# Patient Record
Sex: Female | Born: 1960 | Race: White | Hispanic: No | Marital: Married | Smoking: Never smoker
Health system: Southern US, Community
[De-identification: ages and names within clinical notes are randomized; demographics above are authoritative.]

## PROBLEM LIST (undated history)

## (undated) DIAGNOSIS — K219 Gastro-esophageal reflux disease without esophagitis: Secondary | ICD-10-CM

## (undated) DIAGNOSIS — D649 Anemia, unspecified: Secondary | ICD-10-CM

## (undated) DIAGNOSIS — R519 Headache, unspecified: Secondary | ICD-10-CM

## (undated) DIAGNOSIS — R51 Headache: Secondary | ICD-10-CM

## (undated) DIAGNOSIS — R748 Abnormal levels of other serum enzymes: Secondary | ICD-10-CM

## (undated) DIAGNOSIS — IMO0002 Reserved for concepts with insufficient information to code with codable children: Secondary | ICD-10-CM

## (undated) DIAGNOSIS — Z87442 Personal history of urinary calculi: Secondary | ICD-10-CM

## (undated) DIAGNOSIS — M8580 Other specified disorders of bone density and structure, unspecified site: Secondary | ICD-10-CM

## (undated) DIAGNOSIS — E209 Hypoparathyroidism, unspecified: Secondary | ICD-10-CM

## (undated) DIAGNOSIS — E559 Vitamin D deficiency, unspecified: Secondary | ICD-10-CM

## (undated) DIAGNOSIS — Z86010 Personal history of colonic polyps: Secondary | ICD-10-CM

## (undated) HISTORY — DX: Vitamin D deficiency, unspecified: E55.9

## (undated) HISTORY — PX: TONSILLECTOMY: SUR1361

## (undated) HISTORY — DX: Personal history of colonic polyps: Z86.010

## (undated) HISTORY — DX: Hypercalcemia: E83.52

---

## 1997-04-29 HISTORY — PX: DIAGNOSTIC LAPAROSCOPY: SUR761

## 1998-03-10 ENCOUNTER — Other Ambulatory Visit: Admission: RE | Admit: 1998-03-10 | Discharge: 1998-03-10 | Payer: Self-pay | Admitting: Obstetrics and Gynecology

## 1998-12-01 ENCOUNTER — Encounter: Payer: Self-pay | Admitting: Obstetrics and Gynecology

## 1998-12-01 ENCOUNTER — Ambulatory Visit (HOSPITAL_COMMUNITY): Admission: RE | Admit: 1998-12-01 | Discharge: 1998-12-01 | Payer: Self-pay | Admitting: Internal Medicine

## 1999-02-05 ENCOUNTER — Other Ambulatory Visit: Admission: RE | Admit: 1999-02-05 | Discharge: 1999-02-05 | Payer: Self-pay | Admitting: Obstetrics and Gynecology

## 1999-08-09 ENCOUNTER — Inpatient Hospital Stay (HOSPITAL_COMMUNITY): Admission: AD | Admit: 1999-08-09 | Discharge: 1999-08-09 | Payer: Self-pay | Admitting: Obstetrics and Gynecology

## 1999-08-15 ENCOUNTER — Inpatient Hospital Stay (HOSPITAL_COMMUNITY): Admission: AD | Admit: 1999-08-15 | Discharge: 1999-08-17 | Payer: Self-pay | Admitting: Obstetrics and Gynecology

## 1999-09-20 ENCOUNTER — Other Ambulatory Visit: Admission: RE | Admit: 1999-09-20 | Discharge: 1999-09-20 | Payer: Self-pay | Admitting: Obstetrics and Gynecology

## 2000-08-04 ENCOUNTER — Other Ambulatory Visit: Admission: RE | Admit: 2000-08-04 | Discharge: 2000-08-04 | Payer: Self-pay | Admitting: Obstetrics and Gynecology

## 2000-11-19 ENCOUNTER — Inpatient Hospital Stay (HOSPITAL_COMMUNITY): Admission: AD | Admit: 2000-11-19 | Discharge: 2000-11-19 | Payer: Self-pay | Admitting: Obstetrics and Gynecology

## 2001-02-09 ENCOUNTER — Inpatient Hospital Stay (HOSPITAL_COMMUNITY): Admission: AD | Admit: 2001-02-09 | Discharge: 2001-02-12 | Payer: Self-pay | Admitting: Obstetrics and Gynecology

## 2001-04-06 ENCOUNTER — Other Ambulatory Visit: Admission: RE | Admit: 2001-04-06 | Discharge: 2001-04-06 | Payer: Self-pay | Admitting: Obstetrics and Gynecology

## 2002-04-13 ENCOUNTER — Other Ambulatory Visit: Admission: RE | Admit: 2002-04-13 | Discharge: 2002-04-13 | Payer: Self-pay | Admitting: Obstetrics and Gynecology

## 2004-07-09 ENCOUNTER — Other Ambulatory Visit: Admission: RE | Admit: 2004-07-09 | Discharge: 2004-07-09 | Payer: Self-pay | Admitting: Obstetrics and Gynecology

## 2005-08-06 ENCOUNTER — Other Ambulatory Visit: Admission: RE | Admit: 2005-08-06 | Discharge: 2005-08-06 | Payer: Self-pay | Admitting: Obstetrics and Gynecology

## 2006-10-27 ENCOUNTER — Ambulatory Visit: Payer: Self-pay | Admitting: Internal Medicine

## 2006-12-31 ENCOUNTER — Ambulatory Visit: Payer: Self-pay | Admitting: Internal Medicine

## 2006-12-31 LAB — CONVERTED CEMR LAB
AST: 27 units/L (ref 0–37)
Albumin: 4.3 g/dL (ref 3.5–5.2)
CO2: 27 meq/L (ref 19–32)
Chloride: 109 meq/L (ref 96–112)
Cholesterol: 154 mg/dL (ref 0–200)
Creatinine, Ser: 0.8 mg/dL (ref 0.4–1.2)
Hgb A1c MFr Bld: 5.4 % (ref 4.6–6.0)
Sodium: 141 meq/L (ref 135–145)
Total Bilirubin: 0.8 mg/dL (ref 0.3–1.2)
Total Protein: 7.4 g/dL (ref 6.0–8.3)
VLDL: 45 mg/dL — ABNORMAL HIGH (ref 0–40)

## 2007-01-04 ENCOUNTER — Encounter: Admission: RE | Admit: 2007-01-04 | Discharge: 2007-01-04 | Payer: Self-pay | Admitting: Internal Medicine

## 2007-07-28 ENCOUNTER — Telehealth: Payer: Self-pay | Admitting: Internal Medicine

## 2007-07-28 ENCOUNTER — Ambulatory Visit: Payer: Self-pay | Admitting: Internal Medicine

## 2007-07-28 DIAGNOSIS — J069 Acute upper respiratory infection, unspecified: Secondary | ICD-10-CM | POA: Insufficient documentation

## 2008-02-27 ENCOUNTER — Emergency Department (HOSPITAL_BASED_OUTPATIENT_CLINIC_OR_DEPARTMENT_OTHER): Admission: EM | Admit: 2008-02-27 | Discharge: 2008-02-27 | Payer: Self-pay | Admitting: Emergency Medicine

## 2010-09-11 NOTE — Assessment & Plan Note (Signed)
[facility]                           PRIMARY CARE OFFICE NOTE   Heather [name], Heather [name]                        MRN:          [id number]  DATE:10/27/2006                            DOB:          1960-04-30    REFERRING PHYSICIAN:  Juluis Mire, M.D.   History and physical.   CHIEF COMPLAINT:  New patient to practice, referred by Dr. Arelia Sneddon  regarding high cholesterol.   HISTORY OF PRESENT ILLNESS:  The patient is a 50 year old female to  establish continuity of care.  The patient has been fairly healthy for  most of her life without major illnesses or hospitalizations.  She is  followed by Dr. Arelia Sneddon for routine Pap and pelvics.  She has a history  of ectopic pregnancy in 1999.  She had hypertension during her last  pregnancy; however, since then blood pressure has been normal.  Recent  blood work performed regarding her cholesterol revealed total  cholesterol of 173, triglycerides elevated to 305, HDL was low at 31 and  LDL was 81.  She was somewhat more concerned due to her father recently  undergoing coronary cath with stent placement.   Although she does not exercise on a regular basis, she denies any  symptoms of chest heaviness or significant dyspnea on exertion.  She  does not have significant alcohol intake, she is a social drinker.  Unaware of any diabetes and does not have any symptoms of  hypothyroidism.   PAST MEDICAL HISTORY SUMMARY:  1. Ectopic pregnancy in 1999.  2. Tonsillectomy in 1969.  3. History of headaches.  4. Hyperlipidemia.   CURRENT MEDICATIONS:  None   ALLERGIES:  Include CODEINE which causes nausea.   SOCIAL HISTORY:  The patient is married for the last 18 years.  She has  3 children ages 94, 74 and 5.   FAMILY HISTORY:  Father at age 37 has coronary artery disease.  Cardiologist concerned he may have had a silent heart attack early in  life.  Mother is age 1 and has hypertension. Her side of the family,  however, has history of stroke and coronary artery disease.  Denies any  family history of cancer.   HABITS:  Seldom alcohol, no tobacco use, no recreational drug use   PREVENTIVE CARE HISTORY:  Last Pap was in May 2008.  Last mammogram was  also in May 2008.   REVIEW OF SYSTEMS:  Some nasal congestion with postnasal drip. No chest  pain, palpitation, shortness of breath. No heartburn, nausea, vomiting,  constipation, diarrhea.  All other systems negative.   PHYSICAL EXAMINATION:  VITALS:  Height is 5 feet 6 inches, weight is  159.2 pounds.  Temperature is 98.6, pulse is 86 and BP is 128/82 on the  left arm in a seated position.  GENERAL:  The patient is a pleasant well-developed, well-nourished 77-  year-old white female who appears her stated age.  HEENT:  Normocephalic.  Atraumatic.  Pupils are equal and reactive to  light bilaterally.  Extraocular muscles were intact.  The patient was  anicteric.  Conjunctivae were within normal  limits.  External auditory  canals and tympanic membranes were clear bilaterally.  Oropharyngeal  exam was unremarkable.  The patient had facial acne.  NECK:  Supple with no adenopathy, carotid bruit or thyromegaly.  CHEST:  Normal inspiratory effort.  Chest was clear to auscultation  bilaterally.  No rhonchi, rales or wheezing.  CARDIOVASCULAR:  Regular rate and rhythm.  No significant murmurs, rubs  or gallops appreciated.  ABDOMEN:  Soft, nontender, positive bowel sounds.  No organomegaly.  MUSCULOSKELETAL:  No clubbing, cyanosis or edema.  NEUROLOGIC:  Cranial nerves II through XII were grossly intact, she was  nonfocal.  The patient had palpable pedis dorsalis pulses bilaterally.   IMPRESSIONS/RECOMMENDATIONS:  1. Hypertriglyceridemia.  2. History of ectopic pregnancy in 1999.  3. Family history of coronary artery disease.   HEALTH MAINTENANCE/RECOMMENDATIONS:  1. I recommended dietary changes.  She is fairly liberal with her      fatty food  intake as well as carbohydrate intake.  We will rule out      hypothyroidism and diabetes exacerbating her hyperlipidemia.  2. She will be sent for a screening A1C, fasting BMET and thyroid      function tests.  3. The patient is to follow a low fat, low carb diet and consider      Mediterranean diet.  4. She deferred any statin medications at this time.  Our plan is to      repeat her lipids after dietary changes in September 2008.     Barbette Hair. Artist Pais, DO  Electronically Signed    RDY/MedQ  DD: 10/27/2006  DT: 10/27/2006  Job #: [id number]

## 2011-01-29 LAB — URINALYSIS, ROUTINE W REFLEX MICROSCOPIC
Bilirubin Urine: NEGATIVE
Nitrite: NEGATIVE
Specific Gravity, Urine: 1.019
pH: 6

## 2011-01-29 LAB — PREGNANCY, URINE: Preg Test, Ur: NEGATIVE

## 2011-01-29 LAB — URINE MICROSCOPIC-ADD ON

## 2011-04-05 ENCOUNTER — Encounter (HOSPITAL_COMMUNITY): Payer: Self-pay | Admitting: Pharmacist

## 2011-04-05 NOTE — Patient Instructions (Addendum)
   Your procedure is scheduled on: Monday December 17th  Enter through the Main Entrance of [facility] at: [employer] up the phone at the desk and dial [phone] and inform us of your arrival.  Please call this number if you have any problems the morning of surgery: [phone]  Remember: Do not eat food after midnight: Sunday Do not drink clear liquids after: midnight Sunday Take these medicines the morning of surgery with a SIP OF WATER:none  Do not wear jewelry, make-up, or FINGER nail polish Do not wear lotions, powders, perfumes or deodorant. Do not shave 48 hours prior to surgery. Do not bring valuables to the hospital.  Leave suitcase in the car. After Surgery it may be brought to your room. For patients being admitted to the hospital, checkout time is 11:00am the day of discharge.  Patients discharged on the day of surgery will not be allowed to drive home.     Remember to use your hibiclens as instructed.Please shower with 1/2 bottle the evening before your surgery and the other 1/2 bottle the morning of surgery.

## 2011-04-09 ENCOUNTER — Encounter (HOSPITAL_COMMUNITY)
Admission: RE | Admit: 2011-04-09 | Discharge: 2011-04-09 | Disposition: A | Discharge status: Home / Self Care | Payer: BC Managed Care – PPO | Source: Ambulatory Visit | Attending: Obstetrics and Gynecology | Admitting: Obstetrics and Gynecology

## 2011-04-09 ENCOUNTER — Encounter (HOSPITAL_COMMUNITY): Payer: Self-pay

## 2011-04-09 HISTORY — DX: Gastro-esophageal reflux disease without esophagitis: K21.9

## 2011-04-09 HISTORY — DX: Anemia, unspecified: D64.9

## 2011-04-09 LAB — CBC
HCT: 36.6 % (ref 36.0–46.0)
Hemoglobin: 11.5 g/dL — ABNORMAL LOW (ref 12.0–15.0)
MCH: 26.6 pg (ref 26.0–34.0)
MCV: 84.7 fL (ref 78.0–100.0)
Platelets: 269 10*3/uL (ref 150–400)
RBC: 4.32 MIL/uL (ref 3.87–5.11)

## 2011-04-11 NOTE — H&P (Signed)
  Patient name  Heather [name] DICTATION#  [id number] CSN# [id number]  Juluis Mire, MD 04/11/2011 12:37 PM

## 2011-04-12 NOTE — H&P (Signed)
NAMESHEVY, YANEY NO.:  [id number]  MEDICAL RECORD NO.:  [id number]  LOCATION:  SDC                           FACILITY:  [facility]  PHYSICIAN:  Juluis Mire, M.D.   DATE OF BIRTH:  October 27, 1960  DATE OF ADMISSION:  04/09/2011 DATE OF DISCHARGE:  04/09/2011                             HISTORY & PHYSICAL   DATE OF SURGERY:  April 15, 2011 at [facility].  HISTORY:  The patient is a 50 year old gravida 5 para 3 abortus 2 female who presents for laparoscopic-assisted vaginal hysterectomy with bilateral salpingo-oophorectomy.  The patient had been having trouble with prolonged episodes of vaginal bleeding.  She has been associated with anemia.  We have been trying to control with birth control pills.  She continues to have spotting and abnormal bleeding.  Ultrasound evaluation revealed adenomyosis.  Both ovaries appeared to be normal.  Did have a simple cyst of the left ovary.  We have discussed options including trying the Mirena IUD versus cycling with progesterone agents versus more aggressive therapy.  She now presents for laparoscopic-assisted vaginal hysterectomy.  She does wish the ovaries removed.  We discussed potential need for hormone replacement therapy if this is done.  Possible decrease in sexual desire with removal of ovaries.  The risk of leaving them would be the potential risk of malignant transformation.  She does wish to ovaries taken out.  ALLERGIES:  No known drug allergies.  MEDICATIONS:  She is on iron and birth control pills.  PAST MEDICAL HISTORY:  Usual childhood diseases.  No significant sequelae.  Does have a history of migraine headaches.  PAST SURGICAL HISTORY:  She has had a previous right salpingectomy for an ectopic.  She has had a previous laparoscopy.  She has had a previous D and C.  Previous tonsillectomy.  She has also had 3 vaginal births.  SOCIAL HISTORY:  Reveals no tobacco or alcohol use.  FAMILY HISTORY:   Noncontributory.  REVIEW OF SYSTEMS:  Noncontributory.  PHYSICAL EXAMINATION:  VITAL SIGNS:  The patient is afebrile, stable vital signs. HEENT:  The patient is normocephalic.  Pupils equal, round, reactive to light and accommodation.  Extraocular movements were intact.  Sclerae and conjunctivae clear.  Oropharynx clear. NECK:  Without thyromegaly. BREASTS:  Not examined. LUNGS:  Clear. CARDIOVASCULAR SYSTEM:  Regular rhythm and rate without murmurs or gallops. ABDOMEN:  Benign.  No mass, organomegaly, or tenderness. PELVIC:  Normal external genitalia.  Vaginal mucosa is clear.  Cervix unremarkable.  Uterus upper limits, normal size, slightly boggy consistent with known adenomyosis.  Adnexa unremarkable.  EXTREMITIES: Trace edema. NEUROLOGIC:  Grossly within normal limits.  IMPRESSION:  Abnormal uterine bleeding with associated adenomyosis, unresponsive to conservative management.  PLAN OF MANAGEMENT: The patient will undergo laparoscopic-assisted vaginal hysterectomy with bilateral salpingo-oophorectomy.  The risks of surgery have been discussed including the risk of infection.  The risk of hemorrhage that could require transfusion with the risk of AIDS or hepatitis.  The risk of injury to adjacent organs including bladder, bowel, ureters that could require further exploratory surgery.  Risk of deep venous thrombosis and pulmonary embolus.  The patient does understand the potential risks,  complications, and alternatives.     Juluis Mire, M.D.     JSM/MEDQ  D:  04/11/2011  T:  04/12/2011  Job:  [id number]

## 2011-04-15 ENCOUNTER — Ambulatory Visit (HOSPITAL_COMMUNITY)
Admission: RE | Admit: 2011-04-15 | Discharge: 2011-04-16 | Disposition: A | Discharge status: Home / Self Care | Payer: BC Managed Care – PPO | Source: Ambulatory Visit | Attending: Obstetrics and Gynecology | Admitting: Obstetrics and Gynecology

## 2011-04-15 ENCOUNTER — Ambulatory Visit (HOSPITAL_COMMUNITY): Payer: BC Managed Care – PPO | Admitting: Anesthesiology

## 2011-04-15 ENCOUNTER — Encounter (HOSPITAL_COMMUNITY)
Admission: RE | Disposition: A | Discharge status: Home / Self Care | Payer: Self-pay | Source: Ambulatory Visit | Attending: Obstetrics and Gynecology

## 2011-04-15 ENCOUNTER — Encounter (HOSPITAL_COMMUNITY): Payer: Self-pay | Admitting: Anesthesiology

## 2011-04-15 ENCOUNTER — Other Ambulatory Visit: Payer: Self-pay | Admitting: Obstetrics and Gynecology

## 2011-04-15 ENCOUNTER — Encounter (HOSPITAL_COMMUNITY): Payer: Self-pay | Admitting: *Deleted

## 2011-04-15 DIAGNOSIS — Z90710 Acquired absence of both cervix and uterus: Secondary | ICD-10-CM

## 2011-04-15 DIAGNOSIS — N938 Other specified abnormal uterine and vaginal bleeding: Secondary | ICD-10-CM | POA: Insufficient documentation

## 2011-04-15 DIAGNOSIS — N949 Unspecified condition associated with female genital organs and menstrual cycle: Secondary | ICD-10-CM | POA: Insufficient documentation

## 2011-04-15 DIAGNOSIS — J069 Acute upper respiratory infection, unspecified: Secondary | ICD-10-CM

## 2011-04-15 DIAGNOSIS — N8000 Endometriosis of the uterus, unspecified: Secondary | ICD-10-CM | POA: Insufficient documentation

## 2011-04-15 HISTORY — PX: LAPAROSCOPIC ASSISTED VAGINAL HYSTERECTOMY: SHX5398

## 2011-04-15 LAB — CBC
MCH: 27 pg (ref 26.0–34.0)
MCHC: 31.9 g/dL (ref 30.0–36.0)
MCV: 84.7 fL (ref 78.0–100.0)
Platelets: 281 10*3/uL (ref 150–400)
RDW: 15 % (ref 11.5–15.5)

## 2011-04-15 LAB — HCG, SERUM, QUALITATIVE: Preg, Serum: NEGATIVE

## 2011-04-15 SURGERY — HYSTERECTOMY, VAGINAL, LAPAROSCOPY-ASSISTED
Anesthesia: General

## 2011-04-15 MED ORDER — HYDROMORPHONE HCL PF 1 MG/ML IJ SOLN
INTRAMUSCULAR | Status: DC | PRN
  Administered 2011-04-15: 1 mg via INTRAVENOUS

## 2011-04-15 MED ORDER — MIDAZOLAM HCL 5 MG/5ML IJ SOLN
INTRAMUSCULAR | Status: DC | PRN
  Administered 2011-04-15: .5 mg via INTRAVENOUS
  Administered 2011-04-15: 1.5 mg via INTRAVENOUS

## 2011-04-15 MED ORDER — KETOROLAC TROMETHAMINE 30 MG/ML IJ SOLN
INTRAMUSCULAR | Status: AC
  Filled 2011-04-15: qty 1

## 2011-04-15 MED ORDER — DIPHENHYDRAMINE HCL 12.5 MG/5ML PO ELIX
12.5000 mg | ORAL_SOLUTION | Freq: Four times a day (QID) | ORAL | Status: DC | PRN
  Filled 2011-04-15: qty 5

## 2011-04-15 MED ORDER — LACTATED RINGERS IV SOLN
INTRAVENOUS | Status: DC
  Administered 2011-04-15 (×2): via INTRAVENOUS

## 2011-04-15 MED ORDER — ZOLPIDEM TARTRATE 5 MG PO TABS: 5.0000 mg | ORAL_TABLET | Freq: Every evening | ORAL | Status: DC | PRN

## 2011-04-15 MED ORDER — GLYCOPYRROLATE 0.2 MG/ML IJ SOLN
INTRAMUSCULAR | Status: DC | PRN
  Administered 2011-04-15: .4 mg via INTRAVENOUS

## 2011-04-15 MED ORDER — ROCURONIUM BROMIDE 100 MG/10ML IV SOLN
INTRAVENOUS | Status: DC | PRN
  Administered 2011-04-15: 40 mg via INTRAVENOUS

## 2011-04-15 MED ORDER — HYDROMORPHONE 0.3 MG/ML IV SOLN
INTRAVENOUS | Status: DC
  Administered 2011-04-15: 1.19 mg via INTRAVENOUS
  Administered 2011-04-15: 0.599 mg via INTRAVENOUS
  Administered 2011-04-15: 11:00:00 via INTRAVENOUS
  Administered 2011-04-16: 0.4 mg via INTRAVENOUS
  Administered 2011-04-16: 0.599 mg via INTRAVENOUS

## 2011-04-15 MED ORDER — LIDOCAINE HCL (CARDIAC) 20 MG/ML IV SOLN
INTRAVENOUS | Status: DC | PRN
  Administered 2011-04-15: 60 mg via INTRAVENOUS

## 2011-04-15 MED ORDER — ONDANSETRON HCL 4 MG PO TABS: 4.0000 mg | ORAL_TABLET | Freq: Four times a day (QID) | ORAL | Status: DC | PRN

## 2011-04-15 MED ORDER — NEOSTIGMINE METHYLSULFATE 1 MG/ML IJ SOLN
INTRAMUSCULAR | Status: AC
  Filled 2011-04-15: qty 10

## 2011-04-15 MED ORDER — FENTANYL CITRATE 0.05 MG/ML IJ SOLN
INTRAMUSCULAR | Status: DC | PRN
  Administered 2011-04-15 (×3): 100 ug via INTRAVENOUS
  Administered 2011-04-15: 50 ug via INTRAVENOUS

## 2011-04-15 MED ORDER — PROPOFOL 10 MG/ML IV EMUL
INTRAVENOUS | Status: DC | PRN
  Administered 2011-04-15: 150 mg via INTRAVENOUS

## 2011-04-15 MED ORDER — ROCURONIUM BROMIDE 50 MG/5ML IV SOLN
INTRAVENOUS | Status: AC
  Filled 2011-04-15: qty 1

## 2011-04-15 MED ORDER — KETOROLAC TROMETHAMINE 30 MG/ML IJ SOLN: 15.0000 mg | Freq: Once | INTRAMUSCULAR | Status: DC | PRN

## 2011-04-15 MED ORDER — LIDOCAINE HCL (CARDIAC) 20 MG/ML IV SOLN
INTRAVENOUS | Status: AC
  Filled 2011-04-15: qty 5

## 2011-04-15 MED ORDER — CEFAZOLIN SODIUM 1-5 GM-% IV SOLN
INTRAVENOUS | Status: AC
  Filled 2011-04-15: qty 50

## 2011-04-15 MED ORDER — HYDROMORPHONE HCL PF 1 MG/ML IJ SOLN
INTRAMUSCULAR | Status: AC
  Filled 2011-04-15: qty 1

## 2011-04-15 MED ORDER — LACTATED RINGERS IV SOLN
INTRAVENOUS | Status: DC
  Administered 2011-04-15 – 2011-04-16 (×2): via INTRAVENOUS

## 2011-04-15 MED ORDER — ONDANSETRON HCL 4 MG/2ML IJ SOLN: 4.0000 mg | Freq: Four times a day (QID) | INTRAMUSCULAR | Status: DC | PRN

## 2011-04-15 MED ORDER — PROPOFOL 10 MG/ML IV EMUL
INTRAVENOUS | Status: AC
  Filled 2011-04-15: qty 20

## 2011-04-15 MED ORDER — KETOROLAC TROMETHAMINE 30 MG/ML IJ SOLN
INTRAMUSCULAR | Status: DC | PRN
  Administered 2011-04-15: 30 mg via INTRAVENOUS

## 2011-04-15 MED ORDER — MENTHOL 3 MG MT LOZG
1.0000 | LOZENGE | OROMUCOSAL | Status: DC | PRN
  Filled 2011-04-15: qty 9

## 2011-04-15 MED ORDER — BUPIVACAINE HCL (PF) 0.25 % IJ SOLN
INTRAMUSCULAR | Status: DC | PRN
  Administered 2011-04-15: 7 mL

## 2011-04-15 MED ORDER — DIPHENHYDRAMINE HCL 50 MG/ML IJ SOLN: 12.5000 mg | Freq: Four times a day (QID) | INTRAMUSCULAR | Status: DC | PRN

## 2011-04-15 MED ORDER — CEFAZOLIN SODIUM 1-5 GM-% IV SOLN
1.0000 g | INTRAVENOUS | Status: AC
  Administered 2011-04-15: 1 g via INTRAVENOUS

## 2011-04-15 MED ORDER — NEOSTIGMINE METHYLSULFATE 1 MG/ML IJ SOLN
INTRAMUSCULAR | Status: DC | PRN
  Administered 2011-04-15: 3 mg via INTRAVENOUS

## 2011-04-15 MED ORDER — FENTANYL CITRATE 0.05 MG/ML IJ SOLN
INTRAMUSCULAR | Status: AC
  Filled 2011-04-15: qty 5

## 2011-04-15 MED ORDER — MIDAZOLAM HCL 2 MG/2ML IJ SOLN
INTRAMUSCULAR | Status: AC
  Filled 2011-04-15: qty 2

## 2011-04-15 MED ORDER — HYDROMORPHONE 0.3 MG/ML IV SOLN
INTRAVENOUS | Status: AC
  Filled 2011-04-15: qty 25

## 2011-04-15 MED ORDER — PROMETHAZINE HCL 25 MG/ML IJ SOLN: 6.2500 mg | INTRAMUSCULAR | Status: DC | PRN

## 2011-04-15 MED ORDER — FENTANYL CITRATE 0.05 MG/ML IJ SOLN: 25.0000 ug | INTRAMUSCULAR | Status: DC | PRN

## 2011-04-15 MED ORDER — ACETAMINOPHEN 325 MG PO TABS: 650.0000 mg | ORAL_TABLET | ORAL | Status: DC | PRN

## 2011-04-15 MED ORDER — ONDANSETRON HCL 4 MG/2ML IJ SOLN
INTRAMUSCULAR | Status: DC | PRN
  Administered 2011-04-15 (×2): 2 mg via INTRAVENOUS

## 2011-04-15 MED ORDER — GLYCOPYRROLATE 0.2 MG/ML IJ SOLN
INTRAMUSCULAR | Status: AC
  Filled 2011-04-15: qty 2

## 2011-04-15 MED ORDER — NALOXONE HCL 0.4 MG/ML IJ SOLN: 0.4000 mg | INTRAMUSCULAR | Status: DC | PRN

## 2011-04-15 MED ORDER — ONDANSETRON HCL 4 MG/2ML IJ SOLN
INTRAMUSCULAR | Status: AC
  Filled 2011-04-15: qty 2

## 2011-04-15 MED ORDER — SODIUM CHLORIDE 0.9 % IJ SOLN: 9.0000 mL | INTRAMUSCULAR | Status: DC | PRN

## 2011-04-15 MED ORDER — DEXAMETHASONE SODIUM PHOSPHATE 4 MG/ML IJ SOLN
INTRAMUSCULAR | Status: DC | PRN
  Administered 2011-04-15 (×2): 5 mg via INTRAVENOUS

## 2011-04-15 MED ORDER — SCOPOLAMINE 1 MG/3DAYS TD PT72
MEDICATED_PATCH | TRANSDERMAL | Status: AC
  Administered 2011-04-15: 1 via TRANSDERMAL
  Filled 2011-04-15: qty 1

## 2011-04-15 MED ORDER — MEPERIDINE HCL 25 MG/ML IJ SOLN: 6.2500 mg | INTRAMUSCULAR | Status: DC | PRN

## 2011-04-15 MED ORDER — DEXAMETHASONE SODIUM PHOSPHATE 10 MG/ML IJ SOLN
INTRAMUSCULAR | Status: AC
  Filled 2011-04-15: qty 1

## 2011-04-15 SURGICAL SUPPLY — 36 items
CABLE HIGH FREQUENCY MONO STRZ (ELECTRODE) IMPLANT
CATH ROBINSON RED A/P 16FR (CATHETERS) IMPLANT
CLOTH BEACON ORANGE TIMEOUT ST (SAFETY) ×2 IMPLANT
CONT PATH 16OZ SNAP LID 3702 (MISCELLANEOUS) ×2 IMPLANT
COVER TABLE BACK 60X90 (DRAPES) ×2 IMPLANT
DECANTER SPIKE VIAL GLASS SM (MISCELLANEOUS) IMPLANT
DERMABOND ADVANCED (GAUZE/BANDAGES/DRESSINGS) ×1
DERMABOND ADVANCED .7 DNX12 (GAUZE/BANDAGES/DRESSINGS) ×1 IMPLANT
ELECT REM PT RETURN 9FT ADLT (ELECTROSURGICAL)
ELECTRODE REM PT RTRN 9FT ADLT (ELECTROSURGICAL) IMPLANT
GLOVE BIO SURGEON STRL SZ7 (GLOVE) ×6 IMPLANT
GLOVE BIOGEL PI IND STRL 6.5 (GLOVE) ×1 IMPLANT
GLOVE BIOGEL PI INDICATOR 6.5 (GLOVE) ×1
GOWN PREVENTION PLUS LG XLONG (DISPOSABLE) ×6 IMPLANT
NEEDLE INSUFFLATION 14GA 120MM (NEEDLE) IMPLANT
NS IRRIG 1000ML POUR BTL (IV SOLUTION) ×2 IMPLANT
PACK LAVH (CUSTOM PROCEDURE TRAY) ×2 IMPLANT
SEALER TISSUE G2 CVD JAW 45CM (ENDOMECHANICALS) ×2 IMPLANT
SET IRRIG TUBING LAPAROSCOPIC (IRRIGATION / IRRIGATOR) ×2 IMPLANT
STRIP CLOSURE SKIN 1/4X3 (GAUZE/BANDAGES/DRESSINGS) IMPLANT
SUT MON AB 2-0 CT1 27 (SUTURE) ×2 IMPLANT
SUT VIC AB 0 CT1 18XCR BRD8 (SUTURE) ×2 IMPLANT
SUT VIC AB 0 CT1 27 (SUTURE) ×1
SUT VIC AB 0 CT1 27XBRD ANBCTR (SUTURE) ×1 IMPLANT
SUT VIC AB 0 CT1 36 (SUTURE) ×4 IMPLANT
SUT VIC AB 0 CT1 8-18 (SUTURE) ×2
SUT VICRYL 0 UR6 27IN ABS (SUTURE) ×4 IMPLANT
SUT VICRYL 1 TIES 12X18 (SUTURE) ×2 IMPLANT
SUT VICRYL 4-0 PS2 18IN ABS (SUTURE) ×2 IMPLANT
TOWEL OR 17X24 6PK STRL BLUE (TOWEL DISPOSABLE) ×4 IMPLANT
TRAY FOLEY CATH 14FR (SET/KITS/TRAYS/PACK) ×2 IMPLANT
TROCAR BALLN 12MMX100 BLUNT (TROCAR) ×2 IMPLANT
TROCAR Z-THREAD BLADED 11X100M (TROCAR) IMPLANT
TROCAR Z-THREAD BLADED 5X100MM (TROCAR) ×2 IMPLANT
WARMER LAPAROSCOPE (MISCELLANEOUS) ×2 IMPLANT
WATER STERILE IRR 1000ML POUR (IV SOLUTION) ×2 IMPLANT

## 2011-04-15 NOTE — Op Note (Signed)
Heather Heather [name], Heather [name] NO.:  [id number]  MEDICAL RECORD NO.:  [id number]  LOCATION:  9309                          FACILITY:  [facility]  PHYSICIAN:  Juluis Mire, M.D.   DATE OF BIRTH:  Nov 21, 1960  DATE OF PROCEDURE:  04/15/2011 DATE OF DISCHARGE:                              OPERATIVE REPORT   PREOPERATIVE DIAGNOSIS:  Abnormal uterine bleeding with uterine fibroids.  POSTOPERATIVE DIAGNOSIS:  Abnormal uterine bleeding with uterine fibroids.  PROCEDURE:  Laparoscopic-assisted vaginal hysterectomy.  SURGEON:  Juluis Mire, M.D.  ASSISTANT:  Zelphia Cairo, MD.  ANESTHESIA:  General endotracheal.  ESTIMATED BLOOD LOSS:  500 mL.  PACKS:  Included, none.  DRAINS:  Included, urethral Foley.  INTRAOPERATIVE BLOOD PLACED:  None.  COMPLICATIONS:  None.  INDICATION:  As dictated in history and physical.  DESCRIPTION OF PROCEDURE:  The patient was taken to the OR, placed in supine position.  After satisfactory level of general anesthesia was obtained, the patient was placed in dorsal lithotomy position using Heather [name] stirrups.  The abdomen, perineum, and vagina were prepped out with Betadine.  Bladder was emptied by catheterization.  A Hulka sac was put in place.  The patient was draped in a sterile field.  A subumbilical incision was made with a knife, carried through subcutaneous tissue. Fascia was identified, entered sharply, and incision to the fascia was extended laterally.  Perineum was entered with blunt finger pressure. The open laparoscopic trocar was put in place and secured.  The abdomen was insufflated with carbon dioxide.  The laparoscope was introduced. There was no evidence of injury to adjacent organs.  A 5-mm trocar was put in place in the suprapubic area under direct visualization.  Uterus was enlarged with a large fundal fibroid.  Tubes and ovaries were unremarkable.  The distal end of the right tube was surgically absent. Appendix was  visualized, noted to be normal.  The upper abdomen including liver and tip of the gallbladder were both clear.  Using the EnSeal, we first went to the right side where the right utero-ovarian pedicle was cauterized and incised.  The right tube and mesosalpinx were cauterized and incised.  The right round ligament was cauterized and incised.  We then went to the left side.  The left utero-ovarian ligament was cauterized and incised.  Left tube mesosalpinx was cauterized and incised.  The left round ligament was cauterized and incised.  At this point in time, the laparoscope was removed.  The abdomen was deflated with carbon dioxide.  The patient's legs were repositioned.  The Hulka tenaculum was then removed.  A weighted speculum was placed in the vaginal vault.  Cervix was grasped with a Christella Hartigan tenaculum.  Cul-de-sac was entered sharply. Both uterosacral ligaments were clamped, cut, and suture ligated with 0 Vicryl.  The reflection of vaginal mucosa anteriorly was incised and bladder was dissected superiorly.  Paracervical tissue was clamped, cut, and suture ligated with 0 Vicryl.  Vesicouterine space was identified, entered sharply, and retractors put in place.  Using a clamp, cut, and tie technique with suture ligatures of 0 Vicryl, the parametrium serially separated from the sides of the uterus.  The  uterus was then flipped.  Remaining pedicles were clamped and cut.  Uterus and cervix passed off the operative field and sent to pathology.  Held pedicles were secured with free tie of 0 Vicryl.  Some bleeding from the vaginal cuff was identified and brought under control with figure-of-eights of 0 Vicryl.  Vaginal mucosa was then closed with interrupted figure-of- eights of 2-0 Monocryl.  We had good reapproximation of the vaginal cuff.  There was one small vaginal tear to the left that was closed with a figure-of-eight 0 Vicryl.  Foley was placed to straight drain.  Urine output was  clear and adequate.  At this point in time, the laparoscope was reintroduced.  Using the bipolar, we did have some bleeding from the right ovary, this was brought under control.  A small oozing from the vaginal cuff.  This was also cauterized.  With this, we had good hemostasis.  We thoroughly irrigated the pelvis.  We then deinsufflated and reinflated to look for venous bleeding.  There was no active bleeding.  The abdomen was deflated with carbon dioxide.  All trocars removed.  Subumbilical fascia was closed with figure-of-eight of 0 Vicryl.  Skin was closed with interrupted subcuticular 4-0 Vicryl.  Suprapubic incision was closed with Dermabond.  Sponge, instrument, and needle counts were reported as correct by circulating nurse x2.  Foley catheter remained clear at the time of closure.  The patient tolerated the procedure well, returned to recovery room in good condition.     Juluis Mire, M.D.     JSM/MEDQ  D:  04/15/2011  T:  04/15/2011  Job:  [id number]

## 2011-04-15 NOTE — Addendum Note (Signed)
Addendum  created 04/15/11 1647 by Madison Hickman   Modules edited:Notes Section

## 2011-04-15 NOTE — Anesthesia Postprocedure Evaluation (Signed)
Anesthesia Post Note  Patient: Heather [name]  Procedure(s) Performed:  LAPAROSCOPIC ASSISTED VAGINAL HYSTERECTOMY  Anesthesia type: General  Patient location: PACU  Post pain: Pain level controlled  Post assessment: Post-op Vital signs reviewed  Last Vitals:  Filed Vitals:   04/15/11 1000  BP: 123/69  Pulse: 72  Temp:   Resp: 18    Post vital signs: Reviewed  Level of consciousness: sedated  Complications: No apparent anesthesia complications

## 2011-04-15 NOTE — Preoperative (Signed)
Beta Blockers   Reason not to administer Beta Blockers:Not Applicable 

## 2011-04-15 NOTE — H&P (Signed)
  History changed to just remove uterus ovaries are to remain. Physical exam unchanged

## 2011-04-15 NOTE — Progress Notes (Signed)
Patient ID: Heather [name], female   DOB: 05-Nov-1960, 50 y.o.   MRN: [id number] S: DOING WELL NO NAUSE O:  AF VSS       ABD SOFT MINIMAL INC DRAINAGE       GOOD UO A:  POST OP DONINT WELL P: ROUTINE CARE

## 2011-04-15 NOTE — Progress Notes (Signed)
Spoke to dr. Rodman Pickle about patient urine ouput less than 100. vss stable at this time. abdoment soft. Urine noted in foley tube and bag.. Pt is ok to dc to floor.

## 2011-04-15 NOTE — Brief Op Note (Signed)
04/15/2011  8:47 AM  PATIENT:  Heather [name]  50 y.o. female  PRE-OPERATIVE DIAGNOSIS:  ABNORMAL UTERINE BLEEDING  POST-OPERATIVE DIAGNOSIS:  ABNORMAL UTERINE BLEEDING  PROCEDURE:  Procedure(s): LAPAROSCOPIC ASSISTED VAGINAL HYSTERECTOMY  SURGEON:  Surgeon(s): Roslin Norwood S Marsden Zaino Gretchen Adkins  PHYSICIAN ASSISTANT:   ASSISTANTS: atkins   ANESTHESIA:   general  EBL:  Total I/O In: 1700 [I.V.:1700] Out: 550 [Urine:100; Blood:450]  BLOOD ADMINISTERED:none  DRAINS: Urinary Catheter (Foley)   LOCAL MEDICATIONS USED:  XYLOCAINE 6CC  SPECIMEN:  Source of Specimen:  uterus  DISPOSITION OF SPECIMEN:  PATHOLOGY  COUNTS:  YES  TOURNIQUET:  * No tourniquets in log *  DICTATION: .Other Dictation: Dictation Number [id number]  PLAN OF CARE: Admit for overnight observation  PATIENT DISPOSITION:  PACU - hemodynamically stable.   Delay start of Pharmacological VTE agent (>24hrs) due to surgical blood loss or risk of bleeding:  {YES/NO/NOT APPLICABLE:20182

## 2011-04-15 NOTE — Anesthesia Postprocedure Evaluation (Signed)
  Anesthesia Post-op Note  Patient: Heather [name]  Procedure(s) Performed:  LAPAROSCOPIC ASSISTED VAGINAL HYSTERECTOMY  Patient Location: Women's unit  Anesthesia Type: General  Level of Consciousness: awake, alert  and oriented  Airway and Oxygen Therapy: Patient Spontanous Breathing  Post-op Pain: none  Post-op Assessment: Post-op Vital signs reviewed and Patient's Cardiovascular Status Stable  Post-op Vital Signs: Reviewed and stable  Complications: No apparent anesthesia complications

## 2011-04-15 NOTE — Progress Notes (Signed)
Patient ID: Heather [name], female   DOB: 02-10-1961, 50 y.o.   MRN: [id number] Patient name DICTATION#[id number] CSN# [id number]  Juluis Mire, MD 04/15/2011 8:49 AM

## 2011-04-15 NOTE — Anesthesia Preprocedure Evaluation (Signed)
Anesthesia Evaluation  Patient identified by MRN, date of birth, ID band Patient awake    Reviewed: Allergy & Precautions, H&P , NPO status , Patient's Chart, lab work & pertinent test results, reviewed documented beta blocker date and time   Airway Mallampati: I TM Distance: >3 FB Neck ROM: full    Dental No notable dental hx. (+) Teeth Intact   Pulmonary neg pulmonary ROS,    Pulmonary exam normal       Cardiovascular neg cardio ROS     Neuro/Psych Negative Psych ROS   GI/Hepatic Neg liver ROS,   Endo/Other  Negative Endocrine ROS  Renal/GU negative Renal ROS  Genitourinary negative   Musculoskeletal negative musculoskeletal ROS (+)   Abdominal Normal abdominal exam  (+)   Peds negative pediatric ROS (+)  Hematology negative hematology ROS (+)   Anesthesia Other Findings   Reproductive/Obstetrics negative OB ROS                           Anesthesia Physical Anesthesia Plan  ASA: II  Anesthesia Plan: General   Post-op Pain Management:    Induction: Intravenous  Airway Management Planned: Oral ETT  Additional Equipment:   Intra-op Plan:   Post-operative Plan: Extubation in OR  Informed Consent: I have reviewed the patients History and Physical, chart, labs and discussed the procedure including the risks, benefits and alternatives for the proposed anesthesia with the patient or authorized representative who has indicated his/her understanding and acceptance.   Dental Advisory Given  Plan Discussed with: CRNA  Anesthesia Plan Comments:         Anesthesia Quick Evaluation

## 2011-04-15 NOTE — Anesthesia Procedure Notes (Addendum)
Procedure Name: Intubation Date/Time: 04/15/2011 7:21 AM Performed by: Harriett Rush, Burech Mcfarland ADEDAYO Pre-anesthesia Checklist: Patient identified, Patient being monitored, Emergency Drugs available, Timeout performed and Suction available Patient Re-evaluated:Patient Re-evaluated prior to inductionOxygen Delivery Method: Circle System Utilized Preoxygenation: Pre-oxygenation with 100% oxygen Intubation Type: IV induction Ventilation: Mask ventilation without difficulty Laryngoscope Size: Miller and 3 Grade View: Grade I Tube type: Oral Tube size: 7.0 mm Number of attempts: 1 Placement Confirmation: ETT inserted through vocal cords under direct vision,  positive ETCO2,  CO2 detector and breath sounds checked- equal and bilateral Secured at: 21 cm Tube secured with: Tape (Pinl tape) Dental Injury: Teeth and Oropharynx as per pre-operative assessment

## 2011-04-15 NOTE — Transfer of Care (Signed)
Immediate Anesthesia Transfer of Care Note  Patient: Heather [name]  Procedure(s) Performed:  LAPAROSCOPIC ASSISTED VAGINAL HYSTERECTOMY  Patient Location: PACU  Anesthesia Type: General  Level of Consciousness: awake, alert  and oriented  Airway & Oxygen Therapy: Patient Spontanous Breathing and Patient connected to nasal cannula oxygen  Post-op Assessment: Report given to PACU RN, Post -op Vital signs reviewed and stable and Patient moving all extremities X 4  Post vital signs: Reviewed and stable  Complications: No apparent anesthesia complications

## 2011-04-16 ENCOUNTER — Encounter (HOSPITAL_COMMUNITY): Payer: Self-pay | Admitting: Obstetrics and Gynecology

## 2011-04-16 LAB — CBC
MCH: 27.4 pg (ref 26.0–34.0)
MCHC: 31.7 g/dL (ref 30.0–36.0)
MCV: 86.3 fL (ref 78.0–100.0)
Platelets: 229 10*3/uL (ref 150–400)
RDW: 15.4 % (ref 11.5–15.5)

## 2011-04-16 MED ORDER — HYDROMORPHONE HCL 2 MG PO TABS: 2.0000 mg | ORAL_TABLET | ORAL | Status: AC | PRN

## 2011-04-16 NOTE — Discharge Summary (Signed)
  Patient name  Toby Ayad DICTATION#[id number] CSN# [id number]  [facility], MD 04/16/2011 9:01 AM

## 2011-04-16 NOTE — Progress Notes (Signed)
1 Day Post-Op Procedure(s): LAPAROSCOPIC ASSISTED VAGINAL HYSTERECTOMY  Subjective: Patient reports tolerating PO.    Objective: I have reviewed patient's vital signs.  General: alert   Incisions clear Abdomin soft positive bowel sounds No active bleeding justed d/c'ed foley hgb 8  Assessment: s/p Procedure(s): LAPAROSCOPIC ASSISTED VAGINAL HYSTERECTOMY: stable  Plan: Discharge home  LOS: 1 day    Arvie Villarruel S 04/16/2011, 8:57 AM

## 2011-04-17 NOTE — Discharge Summary (Signed)
NAMELAUNA, GOEDKEN NO.:  [id number]  MEDICAL RECORD NO.:  [id number]  LOCATION:  9309                          FACILITY:  [facility]  PHYSICIAN:  Juluis Mire, M.D.   DATE OF BIRTH:  May 28, 1960  DATE OF ADMISSION:  04/15/2011 DATE OF DISCHARGE:  04/16/2011                              DISCHARGE SUMMARY   ADMITTING DIAGNOSIS:  Uterine fibroids with abnormal uterine bleeding.  DISCHARGE DIAGNOSIS:  Uterine fibroids with abnormal uterine bleeding.  PROCEDURE:  Laparoscopic-assisted vaginal hysterectomy.  For complete history and physical, please see dictated note.  COURSE IN THE HOSPITAL:  The patient underwent above-noted surgery. Postop did well.  Postop hemoglobin was 8.  First postop day was tolerating diet and ambulating without difficulty.  Her incisions were all intact.  Bowel sounds were active.  She had passed flatus.  Had no active vaginal bleeding.  In terms of complication during stay in the hospital, the patient discharged home in stable condition.  DISPOSITION:  The patient is to avoid heavy lifting, vaginal entrance, or driving a car.  She is to call with signs of infection, nausea, vomiting, active bleeding, or excessive pain.  Also instructed signs and symptoms of deep venous thrombosis and pulmonary embolus.  Discharged home on Dilaudid as needed for pain, iron sulfate supplementation and stool softener.  The office will call tomorrow to arrange followup.     Juluis Mire, M.D.     JSM/MEDQ  D:  04/16/2011  T:  04/17/2011  Job:  [id number]

## 2014-05-23 ENCOUNTER — Ambulatory Visit (INDEPENDENT_AMBULATORY_CARE_PROVIDER_SITE_OTHER): Payer: BLUE CROSS/BLUE SHIELD | Admitting: Internal Medicine

## 2014-05-23 ENCOUNTER — Encounter: Payer: Self-pay | Admitting: Internal Medicine

## 2014-05-23 ENCOUNTER — Other Ambulatory Visit: Payer: Self-pay | Admitting: Internal Medicine

## 2014-05-23 VITALS — BP 134/82 | HR 90 | Resp 16 | Ht 66.0 in | Wt 185.0 lb

## 2014-05-23 DIAGNOSIS — E785 Hyperlipidemia, unspecified: Secondary | ICD-10-CM | POA: Diagnosis not present

## 2014-05-23 DIAGNOSIS — Z90710 Acquired absence of both cervix and uterus: Secondary | ICD-10-CM | POA: Diagnosis not present

## 2014-05-23 LAB — CBC WITH DIFFERENTIAL/PLATELET
BASOS ABS: 0 10*3/uL (ref 0.0–0.1)
BASOS PCT: 0 % (ref 0–1)
EOS ABS: 0.1 10*3/uL (ref 0.0–0.7)
EOS PCT: 2 % (ref 0–5)
HCT: 40.6 % (ref 36.0–46.0)
HEMOGLOBIN: 14.1 g/dL (ref 12.0–15.0)
LYMPHS ABS: 2 10*3/uL (ref 0.7–4.0)
LYMPHS PCT: 29 % (ref 12–46)
MCH: 30.5 pg (ref 26.0–34.0)
MCHC: 34.7 g/dL (ref 30.0–36.0)
MCV: 87.7 fL (ref 78.0–100.0)
MONO ABS: 0.6 10*3/uL (ref 0.1–1.0)
MPV: 11.3 fL (ref 8.6–12.4)
Monocytes Relative: 9 % (ref 3–12)
NEUTROS ABS: 4.1 10*3/uL (ref 1.7–7.7)
Neutrophils Relative %: 60 % (ref 43–77)
PLATELETS: 261 10*3/uL (ref 150–400)
RBC: 4.63 MIL/uL (ref 3.87–5.11)
RDW: 13.2 % (ref 11.5–15.5)
WBC: 6.8 10*3/uL (ref 4.0–10.5)

## 2014-05-23 LAB — COMPREHENSIVE METABOLIC PANEL
ALBUMIN: 4.4 g/dL (ref 3.5–5.2)
ALT: 66 U/L — AB (ref 0–35)
AST: 47 U/L — ABNORMAL HIGH (ref 0–37)
Alkaline Phosphatase: 86 U/L (ref 39–117)
BILIRUBIN TOTAL: 0.6 mg/dL (ref 0.2–1.2)
BUN: 12 mg/dL (ref 6–23)
CO2: 28 meq/L (ref 19–32)
CREATININE: 0.84 mg/dL (ref 0.50–1.10)
Calcium: 11.4 mg/dL — ABNORMAL HIGH (ref 8.4–10.5)
Chloride: 103 mEq/L (ref 96–112)
Glucose, Bld: 85 mg/dL (ref 70–99)
POTASSIUM: 4.8 meq/L (ref 3.5–5.3)
Sodium: 138 mEq/L (ref 135–145)
Total Protein: 7.1 g/dL (ref 6.0–8.3)

## 2014-05-23 LAB — TSH: TSH: 1.207 u[IU]/mL (ref 0.350–4.500)

## 2014-05-23 LAB — LIPID PANEL
Cholesterol: 187 mg/dL (ref 0–200)
HDL: 36 mg/dL — ABNORMAL LOW (ref >39)
LDL CALC: 113 mg/dL — AB (ref 0–99)
TRIGLYCERIDES: 188 mg/dL — AB (ref <150)
Total CHOL/HDL Ratio: 5.2 Ratio
VLDL: 38 mg/dL (ref 0–40)

## 2014-05-23 NOTE — Progress Notes (Signed)
   Subjective:    Patient ID: Heather [name], female    DOB: 1960/09/22, 54 y.o.   MRN: [id number]  HPI Heather [name] is a new pt here for primary care  Former care [facility] Dr. Ophelia Charter  PMH of anemia (prior to hysterectomy),  Hyperlipidemia.  She is S/P hysterectomy due to fibroids  She is concerned about weight gain and wonders if her BP is high.  No headache, dizziness, chest pain LE edema  She is under a lot of work stress works in Press photographer at [employer].   Allergies  Allergen Reactions  . Codeine Nausea And Vomiting    Severe Nausea & Vomiting; Pt cannot take percocet or vicocin   Past Medical History  Diagnosis Date  . Anemia     iron supp  . Nasal congestion recent    on tamiflu prescription-  . GERD (gastroesophageal reflux disease)     occasional related to foods, uses pepcid prn  . HYIFOYDX(412.8)    Past Surgical History  Procedure Laterality Date  . Diagnostic laparoscopy  99    ectopic  . Tonsillectomy  as child  . Laparoscopic assisted vaginal hysterectomy  04/15/2011    Procedure: LAPAROSCOPIC ASSISTED VAGINAL HYSTERECTOMY;  Surgeon: Darlyn Chamber;  Location: [facility];  Service: Gynecology;  Laterality: N/A;  . Abdominal hysterectomy     History   Social History  . Marital Status: Married    Spouse Name: N/A    Number of Children: N/A  . Years of Education: N/A   Occupational History  . Not on file.   Social History Main Topics  . Smoking status: Never Smoker   . Smokeless tobacco: Never Used  . Alcohol Use: 0.0 oz/week    0 Not specified per week     Comment: 1 glass of wine per week  . Drug Use: No  . Sexual Activity:    Partners: Male   Other Topics Concern  . Not on file   Social History Narrative   Family History  Problem Relation Age of Onset  . Hypertension Mother   . Aneurysm Mother   . Hypertension Father   . Heart disease Father   . Breast cancer Maternal Aunt   . Diabetes Maternal Uncle   . Stroke Maternal Grandmother    Patient  Active Problem List   Diagnosis Date Noted  . URI 07/28/2007   No current outpatient prescriptions on file prior to visit.   No current facility-administered medications on file prior to visit.       Review of Systems See HPI    Objective:   Physical Exam Physical Exam  Nursing note and vitals reviewed.  Constitutional: She is oriented to person, place, and time. She appears well-developed and well-nourished.  HENT:  Head: Normocephalic and atraumatic.  Cardiovascular: Normal rate and regular rhythm. Exam reveals no gallop and no friction rub.  No murmur heard.  Pulmonary/Chest: Breath sounds normal. She has no wheezes. She has no rales.  Neurological: She is alert and oriented to person, place, and time.  Skin: Skin is warm and dry.  Psychiatric: She has a normal mood and affect. Her behavior is normal.         Assessment & Plan:  wwt gain  Will check all labs and thyroid further management based on results Work stress  Schedule CPE

## 2014-05-24 ENCOUNTER — Encounter: Payer: Self-pay | Admitting: Internal Medicine

## 2014-05-24 ENCOUNTER — Encounter: Payer: Self-pay | Admitting: *Deleted

## 2014-05-24 DIAGNOSIS — E785 Hyperlipidemia, unspecified: Secondary | ICD-10-CM | POA: Insufficient documentation

## 2014-05-24 DIAGNOSIS — Z90710 Acquired absence of both cervix and uterus: Secondary | ICD-10-CM | POA: Insufficient documentation

## 2014-05-24 LAB — HEPATITIS PANEL, ACUTE
HCV AB: NEGATIVE
HEP A IGM: NONREACTIVE
HEP B S AG: NEGATIVE
Hep B C IgM: NONREACTIVE

## 2014-05-24 LAB — VITAMIN D 25 HYDROXY (VIT D DEFICIENCY, FRACTURES): VIT D 25 HYDROXY: 18 ng/mL — AB (ref 30–100)

## 2014-05-25 LAB — ANA: Anti Nuclear Antibody(ANA): NEGATIVE

## 2014-06-02 ENCOUNTER — Encounter: Payer: Self-pay | Admitting: Internal Medicine

## 2014-06-02 ENCOUNTER — Ambulatory Visit (INDEPENDENT_AMBULATORY_CARE_PROVIDER_SITE_OTHER): Payer: BLUE CROSS/BLUE SHIELD | Admitting: Internal Medicine

## 2014-06-02 DIAGNOSIS — R7401 Elevation of levels of liver transaminase levels: Secondary | ICD-10-CM

## 2014-06-02 DIAGNOSIS — R740 Nonspecific elevation of levels of transaminase and lactic acid dehydrogenase [LDH]: Secondary | ICD-10-CM | POA: Diagnosis not present

## 2014-06-02 HISTORY — DX: Hypercalcemia: E83.52

## 2014-06-02 LAB — HEPATITIS PANEL, ACUTE
HCV Ab: NEGATIVE
HEP B C IGM: NONREACTIVE
HEP B S AG: NEGATIVE
Hep A IgM: NONREACTIVE

## 2014-06-02 NOTE — Progress Notes (Signed)
Subjective:    Patient ID: Heather [name], female    DOB: 04-15-1961, 54 y.o.   MRN: [id number]  HPI 05/23/2014 Assessment & Plan:  wwt gain Will check all labs and thyroid further management based on results Work stress  Schedule CPE  TODAY:  See labs    Elevated transaminases  No blood transfusions,  Reports rare ETOH use     Elevated calcium  Last chemical analysis 6-7 years ago.  Reports fatigue , some constipation  NO Fh of parathyroid abnormality .  She is not taking any supplemental CA now  Low vitamin D  Pt is wondering if she is in menopause  S/P hysterectomy .   NO flushing   Allergies  Allergen Reactions  . Codeine Nausea And Vomiting    Severe Nausea & Vomiting; Pt cannot take percocet or vicocin   Past Medical History  Diagnosis Date  . Anemia     iron supp  . Nasal congestion recent    on tamiflu prescription-  . GERD (gastroesophageal reflux disease)     occasional related to foods, uses pepcid prn  . YHCWCBJS(283.1)    Past Surgical History  Procedure Laterality Date  . Diagnostic laparoscopy  99    ectopic  . Tonsillectomy  as child  . Laparoscopic assisted vaginal hysterectomy  04/15/2011    Procedure: LAPAROSCOPIC ASSISTED VAGINAL HYSTERECTOMY;  Surgeon: Darlyn Chamber;  Location: [facility];  Service: Gynecology;  Laterality: N/A;  . Abdominal hysterectomy     History   Social History  . Marital Status: Married    Spouse Name: N/A    Number of Children: N/A  . Years of Education: N/A   Occupational History  . Not on file.   Social History Main Topics  . Smoking status: Never Smoker   . Smokeless tobacco: Never Used  . Alcohol Use: 0.0 oz/week    0 Not specified per week     Comment: 1 glass of wine per week  . Drug Use: No  . Sexual Activity:    Partners: Male   Other Topics Concern  . Not on file   Social History Narrative   Family History  Problem Relation Age of Onset  . Hypertension Mother   . Aneurysm Mother   .  Hypertension Father   . Heart disease Father   . Breast cancer Maternal Aunt   . Diabetes Maternal Uncle   . Stroke Maternal Grandmother    Patient Active Problem List   Diagnosis Date Noted  . S/P hysterectomy 05/24/2014  . Hyperlipidemia 05/24/2014  . URI 07/28/2007   No current outpatient prescriptions on file prior to visit.   No current facility-administered medications on file prior to visit.       Review of Systems See HPI    Objective:   Physical Exam Physical Exam  Nursing note and vitals reviewed.  Constitutional: She is oriented to person, place, and time. She appears well-developed and well-nourished.  HENT:  Head: Normocephalic and atraumatic.  Cardiovascular: Normal rate and regular rhythm. Exam reveals no gallop and no friction rub.  No murmur heard.  Pulmonary/Chest: Breath sounds normal. She has no wheezes. She has no rales.  Neurological: She is alert and oriented to person, place, and time.  Skin: Skin is warm and dry.  Psychiatric: She has a normal mood and affect. Her behavior is normal.          Assessment & Plan:  elevataed transaminases  Will check hepatitis  panel,   If still elevated will need liver U/S  Elevated CA   Check PTH    Further management based on results of above   Pt voices understanding

## 2014-06-03 LAB — PTH, INTACT AND CALCIUM
Calcium: 11 mg/dL — ABNORMAL HIGH (ref 8.4–10.5)
PTH: 143 pg/mL — ABNORMAL HIGH (ref 14–64)

## 2014-06-03 LAB — FOLLICLE STIMULATING HORMONE: FSH: 12.6 m[IU]/mL

## 2014-06-07 ENCOUNTER — Telehealth: Payer: Self-pay | Admitting: Internal Medicine

## 2014-06-07 DIAGNOSIS — R7989 Other specified abnormal findings of blood chemistry: Secondary | ICD-10-CM

## 2014-06-07 NOTE — Telephone Encounter (Signed)
Spoke with pt and informed of PTH and labs  Will set up endocrine referral

## 2014-06-08 NOTE — Telephone Encounter (Signed)
Heather [name] is set up with [facility]. On 06/16/14 @ 11:15. I left info on patients VM-eh

## 2014-06-16 ENCOUNTER — Ambulatory Visit: Payer: BLUE CROSS/BLUE SHIELD | Admitting: Internal Medicine

## 2014-06-21 ENCOUNTER — Encounter: Payer: Self-pay | Admitting: Internal Medicine

## 2014-06-21 ENCOUNTER — Ambulatory Visit (INDEPENDENT_AMBULATORY_CARE_PROVIDER_SITE_OTHER): Payer: BLUE CROSS/BLUE SHIELD | Admitting: Internal Medicine

## 2014-06-21 DIAGNOSIS — E559 Vitamin D deficiency, unspecified: Secondary | ICD-10-CM | POA: Insufficient documentation

## 2014-06-21 HISTORY — DX: Vitamin D deficiency, unspecified: E55.9

## 2014-06-21 NOTE — Progress Notes (Signed)
Patient ID: Heather [name], female   DOB: 1960-10-05, 54 y.o.   MRN: [id number]   HPI  Heather [name] is a 54 y.o.-year-old female, referred by her PCP, Dr. Jackqulyn Livings, for evaluation for hypercalcemia/hyperparathyroidism.  Pt was dx with hypercalcemia in 04/2014. I reviewed pt's pertinent labs: Lab Results  Component Value Date   PTH 143* 06/02/2014   CALCIUM 11.0* 06/02/2014   CALCIUM 11.4* 05/23/2014   CALCIUM 10.2 12/31/2006   Has a remote heel DEXA scan - normal (no records):  + fracture of R ankle at the beginning of 05/2014.  + h/o kidney stone - x1: 02/2011 >> saw urology - resolved.  No h/o CKD. Last BUN/Cr: Lab Results  Component Value Date   BUN 12 05/23/2014   CREATININE 0.84 05/23/2014   Pt is not on HCTZ.  She has a h/o vitamin D deficiency. Last vit D level was 18 in 05/23/2014.  Pt is not on calcium and vitamin D; she also eats dairy and green, leafy, vegetables, but not a lot.  Pt does not have a FH of hypercalcemia, pituitary tumors, thyroid cancer. No osteoporosis, but osteopenia in mother. Brother had kidney stones.   I reviewed her chart and she also has a history of GERD >> Pepcid prn, anemia.  ROS: Constitutional: + weight gain, + fatigue, no subjective hyperthermia/hypothermia, + poor sleep Eyes: no blurry vision, no xerophthalmia ENT: no sore throat, no nodules palpated in throat, no dysphagia/odynophagia, no hoarseness Cardiovascular: no CP/SOB/+ palpitations/no leg swelling Respiratory: no cough/SOB Gastrointestinal: no N/V/D/C, + heartburn Musculoskeletal: no muscle/joint aches Skin: no rashes Neurological: no tremors/numbness/tingling/dizziness, + HA Psychiatric: no depression/anxiety + Low libido  Past Medical History  Diagnosis Date  . Anemia     iron supp  . Nasal congestion recent    on tamiflu prescription-  . GERD (gastroesophageal reflux disease)     occasional related to foods, uses pepcid prn  . OVZCHYIF(027.7)    Past  Surgical History  Procedure Laterality Date  . Diagnostic laparoscopy  99    ectopic  . Tonsillectomy  as child  . Laparoscopic assisted vaginal hysterectomy  04/15/2011    Procedure: LAPAROSCOPIC ASSISTED VAGINAL HYSTERECTOMY;  Surgeon: Darlyn Chamber;  Location: [facility];  Service: Gynecology;  Laterality: N/A;  . Abdominal hysterectomy     History   Social History  . Marital Status: Married    Spouse Name: N/A  . Number of Children: 3   Occupational History  .  accountant    Social History Main Topics  . Smoking status: Never Smoker   . Smokeless tobacco: Never Used  . Alcohol Use: 0.0 oz/week    0 Standard drinks or equivalent per week     Comment: 1 glass of wine per month  . Drug Use: No  . Sexual Activity:    Partners: Male  No current outpatient prescriptions on file.  Allergies  Allergen Reactions  . Codeine Nausea And Vomiting    Severe Nausea & Vomiting; Pt cannot take percocet or vicocin   Family History  Problem Relation Age of Onset  . Hypertension Mother   . Aneurysm Mother   . Hypertension Father   . Heart disease Father   . Breast cancer Maternal Aunt   . Diabetes Maternal Uncle   . Stroke Maternal Grandmother    PE: BP 118/76 mmHg  Pulse 80  Temp(Src) 98.3 F (36.8 C) (Oral)  Resp 12  Ht 5\' 6"  (1.676 m)  Wt 189 lb (85.73  kg)  BMI 30.52 kg/m2  SpO2 98%  LMP 03/20/2011 Wt Readings from Last 3 Encounters:  06/21/14 189 lb (85.73 kg)  06/02/14 186 lb (84.369 kg)  05/23/14 185 lb (83.915 kg)   Constitutional: overweight, in NAD. No kyphosis. Eyes: PERRLA, EOMI, no exophthalmos ENT: moist mucous membranes, no thyromegaly, no cervical lymphadenopathy Cardiovascular: RRR, No MRG Respiratory: CTA B Gastrointestinal: abdomen soft, NT, ND, BS+ Musculoskeletal: no deformities, strength intact in all 4; right foot in cast Skin: moist, warm, no rashes Neurological: no tremor with outstretched hands, DTR normal in all 4  Assessment: 1.  Hypercalcemia/hyperparathyroidism  2. Vitamin D deficiency  Plan: 1. Hypercalcemia/hyperparathyroidism Patient has had 2 instances of elevated calcium, with the highest level being at 11.4. An intact PTH level was also high, at 143 .  Patient also  has vitamin D deficiency, 18, not for any supplements as of now.  She has possible complications from hypercalcemia: + h/o nephrolithiasis, + fracture (although this was traumatic). No abdominal pain,  constipation, depression,  generalized bone pain. - I discussed with the patient about the physiology of calcium and parathyroid hormone, and possible side effects from increased PTH, including kidney stones, osteoporosis, abdominal pain, etc.  - We discussed that we need to check whether her hyperparathyroidism is primary (Familial hypercalcemic hypocalciuria or parathyroid adenoma) or secondary (to conditions like: vitamin D deficiency, calcium malabsorption, hypercalciuria, renal insufficiency, etc.). - Of the 4 classic criteria for parathyroid surgery, she is very close to the 52 year old age criterion:  Increased calcium by more than 1 mg/dL above the upper limit of normal  Kidney ds.  Osteoporosis (or Vb fx) Age <67 years old New (2013): High UCa >400 mg/d and increased stone risk by biochemical stone risk analysis Presence of nephrolithiasis or nephrocalcinosis Pt's preference!  - I discussed with the patient that we will first need to bring her vitamin D level to normal, and then check the following tests: calcium level intact PTH ([facility]) Magnesium Phosphorus vitamin D 1,25 HO  24h urinary calcium/creatinine ratio - will need instructions for urine collection and the jug - If the tests indicate a parathyroid adenoma, I will refer her to surgery. We discussed possible consequences of hyperparathyroidism: OP, nephrolithiasis (~1/3 pts will develop complications over 15 years).   - The patient was fairly unhappy with the plan above, she  told me that she read extensively online about hyperparathyroidism and she found out that almost 100% of high PTH patients have a parathyroid adenoma. She had many questions, and also asked me why I do not think it is necessary to order a parathyroid sestamibi scan to start with. I explained that this is a localization test only, it should not be used for diagnosis of parathyroid adenoma. This is usually a test done by the surgeon to prepare for the surgery. They can be possibly false negative  or false positive results with the test. In few situations is used outside that indication, however for her, we need to start with normalizing her vitamin D and then checking the labs above. I reassured her that I will do a complete investigation to find out whether she has a primary adenoma or not.  - I will see the patient back in 4 months, I will discuss through my chart or by phone  2. Vitamin D deficiency - We'll start over-the-counter vitamin D 2000 units daily and will repeat a vitamin D level in 2 months. If the level is normal, we'll go ahead  with the labs above.  - time spent with the patient: 1 hour, of which >50% was spent in obtaining information about her symptoms, reviewing her previous labs, evaluations, and treatments, counseling her about her condition (please see the discussed topics above), and developing a plan to further investigate it. She had a number of questions which I addressed.

## 2014-06-21 NOTE — Patient Instructions (Signed)
Please start over the counter vitamin D 2000 units daily.  Please come back for a vitamin D level in 2 months.  Hypercalcemia Hypercalcemia means the calcium in your blood is too high. Calcium in our blood is important for the control of many things, such as:  Blood clotting.  Conducting of nerve impulses.  Muscle contraction.  Maintaining teeth and bone health.  Other body functions. In the bloodstream, calcium maintains a constant balance with another mineral, phosphate. Calcium is absorbed into the body through the small intestine. This is helped by vitamin D. Calcium levels are maintained mostly by vitamin D and a hormone (parathyroid hormone). But the kidneys also help. Hypercalcemia can happen when the concentration of calcium is too high for the kidneys to maintain balance. The body maintains a balance between the calcium we eat and the calcium already in our body. If calcium intake is increased or we cannot use calcium properly, there may be problems. Some common sources of calcium are:   Dairy products.  Nuts.  Eggs.  Whole grains.  Legumes.  Green leafy vegetables. CAUSES There are many causes of this condition, but some common ones are:  Hyperparathyroidism. This is an overactivity of the parathyroid gland.  Cancers of the breast, kidney, lung, head, and neck are common causes of calcium increases.  Medications that cause you to urinate more often (diuretics), nausea, vomiting, and diarrhea also increase the calcium in the blood.  Overuse of calcium-containing antacids. SYMPTOMS  Many patients with mild hypercalcemia have no symptoms. For those with symptoms, common problems include:  Loss of appetite.  Constipation.  Increased thirst.  Heart rhythm changes.  Abnormal thinking.  Nausea.  Abdominal pain.  Kidney stones.  Mood swings.  Coma and death when severe.  Vomiting.  Increased urination.  High blood pressure.  Confusion. DIAGNOSIS    Your caregiver will do a medical history and perform a physical exam on you.  Calcium and parathyroid hormone (PTH) may be measured with a blood test. TREATMENT   The treatment depends on the calcium level and what is causing the higher level. Hypercalcemia can be life threatening. Fast lowering of the calcium level may be necessary.  With normal kidney function, fluids can be given by vein to clear the excess calcium. Hemodialysis works well to reduce dangerous calcium levels if there is poor kidney function. This is a procedure in which a machine is used to filter out unwanted substances. The blood is then returned to the body.  Drugs, such as diuretics, can be given after adequate fluid intake is established. These medications help the kidneys get rid of extra calcium. Drugs that lessen (inhibit) bone loss are helpful in gaining long-term control. Phosphate pills help lower high calcium levels caused by a low supply of phosphate. Anti-inflammatory agents such as steroids are helpful with some cancers and toxic levels of vitamin D.  Treatment of the underlying cause of the hypercalcemia will also correct the imbalance. Hyperparathyroidism is usually treated by surgical removal of one or more of the parathyroid glands and any tissue, other than the glands themselves, that is producing too much hormone.  The hypercalcemia caused by cancer is difficult to treat without controlling the cancer. Symptoms can be improved with fluids and drug therapy as outlined above. PROGNOSIS   Surgery to remove the parathyroid glands is usually successful. This also depends on the amount of damage to the kidneys and whether or not it can be treated.  Mild hypercalcemia can be controlled  with good fluid intake and the use of effective medications.  Hypercalcemia often develops as a late complication of cancer. The expected outlook is poor without effective anticancer therapy. PREVENTION   If you are at risk  for developing hypercalcemia, be familiar with early symptoms. Report these to your caregiver.  Good fluid intake (up to four quarts of liquid a day if possible) is helpful.  Try to control nausea and vomiting, and treat fevers to avoid dehydration.  Lowering the amount of calcium in your diet is not necessary. High blood calcium reduces absorption of calcium in the intestine.  Stay as active as possible. SEEK IMMEDIATE MEDICAL CARE IF:   You develop chest pain, sweating, or shortness of breath.  You get confused, feel faint or pass out.  You develop severe nausea and vomiting. MAKE SURE YOU:   Understand these instructions.  Will watch your condition.  Will get help right away if you are not doing well or get worse. Document Released: 06/29/2004 Document Revised: 08/30/2013 Document Reviewed: 04/10/2010 [facility] Patient Information 2015 [place], [place]. This information is not intended to replace advice given to you by your health care provider. Make sure you discuss any questions you have with your health care provider.

## 2014-07-15 ENCOUNTER — Telehealth: Payer: Self-pay | Admitting: Internal Medicine

## 2014-07-15 NOTE — Telephone Encounter (Signed)
Heather [name]  Call this pt and advise her that I need to get a repeat liver blood work on her.   Hepatic profile was ordered back in [place] but apologize to her as the  Lab did not perform test.  She can come in and have just liver profile done .   Route back with date she will get this done    thanks

## 2014-07-21 ENCOUNTER — Encounter: Payer: Self-pay | Admitting: Internal Medicine

## 2014-07-21 ENCOUNTER — Ambulatory Visit (INDEPENDENT_AMBULATORY_CARE_PROVIDER_SITE_OTHER): Payer: BLUE CROSS/BLUE SHIELD | Admitting: Internal Medicine

## 2014-07-21 ENCOUNTER — Encounter: Payer: Self-pay | Admitting: *Deleted

## 2014-07-21 VITALS — BP 139/86 | HR 79 | Resp 16 | Ht 65.5 in | Wt 184.0 lb

## 2014-07-21 DIAGNOSIS — Z23 Encounter for immunization: Secondary | ICD-10-CM

## 2014-07-21 DIAGNOSIS — Z1231 Encounter for screening mammogram for malignant neoplasm of breast: Secondary | ICD-10-CM

## 2014-07-21 DIAGNOSIS — R740 Nonspecific elevation of levels of transaminase and lactic acid dehydrogenase [LDH]: Secondary | ICD-10-CM

## 2014-07-21 DIAGNOSIS — Z0000 Encounter for general adult medical examination without abnormal findings: Secondary | ICD-10-CM | POA: Diagnosis not present

## 2014-07-21 DIAGNOSIS — E559 Vitamin D deficiency, unspecified: Secondary | ICD-10-CM

## 2014-07-21 DIAGNOSIS — E785 Hyperlipidemia, unspecified: Secondary | ICD-10-CM

## 2014-07-21 DIAGNOSIS — R7401 Elevation of levels of liver transaminase levels: Secondary | ICD-10-CM

## 2014-07-21 LAB — POCT URINALYSIS DIPSTICK
Bilirubin, UA: NEGATIVE
Blood, UA: NEGATIVE
Glucose, UA: NEGATIVE
KETONES UA: NEGATIVE
Leukocytes, UA: NEGATIVE
Nitrite, UA: NEGATIVE
PH UA: 6.5
PROTEIN UA: NEGATIVE
Spec Grav, UA: 1.01
UROBILINOGEN UA: NEGATIVE

## 2014-07-21 LAB — HEPATIC FUNCTION PANEL
ALT: 93 U/L — ABNORMAL HIGH (ref 0–35)
AST: 65 U/L — ABNORMAL HIGH (ref 0–37)
Albumin: 4.2 g/dL (ref 3.5–5.2)
Alkaline Phosphatase: 86 U/L (ref 39–117)
BILIRUBIN TOTAL: 0.4 mg/dL (ref 0.2–1.2)
Bilirubin, Direct: 0.1 mg/dL (ref 0.0–0.3)
Indirect Bilirubin: 0.3 mg/dL (ref 0.2–1.2)
Total Protein: 6.7 g/dL (ref 6.0–8.3)

## 2014-07-21 MED ORDER — VITAMIN D (ERGOCALCIFEROL) 1.25 MG (50000 UNIT) PO CAPS: 50000.0000 [IU] | ORAL_CAPSULE | ORAL | Status: DC

## 2014-07-21 NOTE — Addendum Note (Signed)
Addended by: Emi Belfast D on: 07/21/2014 02:15 PM   Modules accepted: Orders

## 2014-07-21 NOTE — Telephone Encounter (Signed)
Heather [name] is coming in  today and we can get these the additional labs at her visit

## 2014-07-21 NOTE — Addendum Note (Signed)
Addended by: Susanne Greenhouse E on: 07/21/2014 02:29 PM   Modules accepted: Orders

## 2014-07-21 NOTE — Addendum Note (Signed)
Addended by: Susanne Greenhouse E on: 07/21/2014 02:14 PM   Modules accepted: Orders

## 2014-07-21 NOTE — Progress Notes (Signed)
Subjective:    Patient ID: Heather [name], female    DOB: 08/05/1960, 54 y.o.   MRN: [id number]  HPI 06/21/2014 endocrine note Plan: 1. Hypercalcemia/hyperparathyroidism Patient has had 2 instances of elevated calcium, with the highest level being at 11.4. An intact PTH level was also high, at 143 .  Patient also has vitamin D deficiency, 18, not for any supplements as of now.  She has possible complications from hypercalcemia: + h/o nephrolithiasis, + fracture (although this was traumatic). No abdominal pain, constipation, depression, generalized bone pain. - I discussed with the patient about the physiology of calcium and parathyroid hormone, and possible side effects from increased PTH, including kidney stones, osteoporosis, abdominal pain, etc.  - We discussed that we need to check whether her hyperparathyroidism is primary (Familial hypercalcemic hypocalciuria or parathyroid adenoma) or secondary (to conditions like: vitamin D deficiency, calcium malabsorption, hypercalciuria, renal insufficiency, etc.). - Of the 4 classic criteria for parathyroid surgery, she is very close to the 52 year old age criterion:   Increased calcium by more than 1 mg/dL above the upper limit of normal   Kidney ds.   Osteoporosis (or Vb fx)  Age <46 years old New (2013):  High UCa >400 mg/d and increased stone risk by biochemical stone risk analysis  Presence of nephrolithiasis or nephrocalcinosis  Pt's preference! - I discussed with the patient that we will first need to bring her vitamin D level to normal, and then check the following tests: calcium level intact PTH ([facility]) Magnesium Phosphorus vitamin D 1,25 HO 24h urinary calcium/creatinine ratio - will need instructions for urine collection and the jug - If the tests indicate a parathyroid adenoma, I will refer her to surgery. We discussed possible consequences of hyperparathyroidism: OP, nephrolithiasis (~1/3 pts will  develop complications over 15 years).   - The patient was fairly unhappy with the plan above, she told me that she read extensively online about hyperparathyroidism and she found out that almost 100% of high PTH patients have a parathyroid adenoma. She had many questions, and also asked me why I do not think it is necessary to order a parathyroid sestamibi scan to start with. I explained that this is a localization test only, it should not be used for diagnosis of parathyroid adenoma. This is usually a test done by the surgeon to prepare for the surgery. They can be possibly false negative or false positive results with the test. In few situations is used outside that indication, however for her, we need to start with normalizing her vitamin D and then checking the labs above. I reassured her that I will do a complete investigation to find out whether she has a primary adenoma or not.  - I will see the patient back in 4 months, I will discuss through my chart or by phone  TODAY  Heather [name] is here for CPE   HM:  She has not had preventive care in quite some time.   Needs mm,  Colonoscopy,   She is a non-smoker  Elevated CA  See above  Evaluation in process for hyper PTH  She is trying to remember to take vitamin D daily   Elevated transaminases  Hepatitis neg,  Rare ETOH consumption     Allergies  Allergen Reactions  . Codeine Nausea And Vomiting    Severe Nausea & Vomiting; Pt cannot take percocet or vicocin   Past Medical History  Diagnosis Date  . Anemia     iron supp  .  Nasal congestion recent    on tamiflu prescription-  . GERD (gastroesophageal reflux disease)     occasional related to foods, uses pepcid prn  . AJOINOMV(672.0)    Past Surgical History  Procedure Laterality Date  . Diagnostic laparoscopy  99    ectopic  . Tonsillectomy  as child  . Laparoscopic assisted vaginal hysterectomy  04/15/2011    Procedure: LAPAROSCOPIC ASSISTED VAGINAL HYSTERECTOMY;  Surgeon: Darlyn Chamber;  Location: [facility];  Service: Gynecology;  Laterality: N/A;  . Abdominal hysterectomy     History   Social History  . Marital Status: Married    Spouse Name: N/A  . Number of Children: N/A  . Years of Education: N/A   Occupational History  . Not on file.   Social History Main Topics  . Smoking status: Never Smoker   . Smokeless tobacco: Never Used  . Alcohol Use: 0.0 oz/week    0 Standard drinks or equivalent per week     Comment: 1 glass of wine per week  . Drug Use: No  . Sexual Activity:    Partners: Male   Other Topics Concern  . Not on file   Social History Narrative   Family History  Problem Relation Age of Onset  . Hypertension Mother   . Aneurysm Mother   . Hypertension Father   . Heart disease Father   . Breast cancer Maternal Aunt   . Diabetes Maternal Uncle   . Stroke Maternal Grandmother    Patient Active Problem List   Diagnosis Date Noted  . Vitamin D deficiency 06/21/2014  . Serum calcium elevated 06/02/2014  . Elevated transaminase measurement 06/02/2014  . S/P hysterectomy 05/24/2014  . Hyperlipidemia 05/24/2014  . URI 07/28/2007   No current outpatient prescriptions on file prior to visit.   No current facility-administered medications on file prior to visit.      Review of Systems See HPI    Objective:   Physical Exam Physical Exam  Nursing note and vitals reviewed.  Constitutional: She is oriented to person, place, and time. She appears well-developed and well-nourished.  HENT:  Head: Normocephalic and atraumatic.  Right Ear: Tympanic membrane and ear canal normal. No drainage. Tympanic membrane is not injected and not erythematous.  Left Ear: Tympanic membrane and ear canal normal. No drainage. Tympanic membrane is not injected and not erythematous.  Nose: Nose normal. Right sinus exhibits no maxillary sinus tenderness and no frontal sinus tenderness. Left sinus exhibits no maxillary sinus tenderness and no frontal sinus  tenderness.  Mouth/Throat: Oropharynx is clear and moist. No oral lesions. No oropharyngeal exudate.  Eyes: Conjunctivae and EOM are normal. Pupils are equal, round, and reactive to light.  Neck: Normal range of motion. Neck supple. No JVD present. Carotid bruit is not present. No mass and no thyromegaly present.  Cardiovascular: Normal rate, regular rhythm, S1 normal, S2 normal and intact distal pulses. Exam reveals no gallop and no friction rub.  No murmur heard.  Pulses:  Carotid pulses are 2+ on the right side, and 2+ on the left side.  Dorsalis pedis pulses are 2+ on the right side, and 2+ on the left side.  No carotid bruit. No LE edema  Pulmonary/Chest: Breath sounds normal. She has no wheezes. She has no rales. She exhibits no tenderness.  Breast no discrete mass no nipple discharge no axillary adenopathy bilaterally Abdominal: Soft. Bowel sounds are normal. She exhibits no distension and no mass. There is no hepatosplenomegaly. There is  no tenderness. There is no CVA tenderness.  Rectal no mass guaiac neg Musculoskeletal: Normal range of motion.  No active synovitis to joints.  Lymphadenopathy:  She has no cervical adenopathy.  She has no axillary adenopathy.  Right: No inguinal and no supraclavicular adenopathy present.  Left: No inguinal and no supraclavicular adenopathy present.  Neurological: She is alert and oriented to person, place, and time. She has normal strength and normal reflexes. She displays no tremor. No cranial nerve deficit or sensory deficit. Coordination and gait normal.  Skin: Skin is warm and dry. No rash noted. No cyanosis. Nails show no clubbing.  Psychiatric: She has a normal mood and affect. Her speech is normal and behavior is normal. Cognition and memory are normal.          Assessment & Plan:  HM:  Pt wants to call back with name of GI MD and will refer for screening colonoscopy ,   Will set up 3D mm,  Tdap today   Elevated lfts     Hepatitis neg,   Repeat today  Check ANA  If still elevated will need ultrasound  Elevated calcium/ ??  hyperPTH  See above    Evaluation in progress  Vitamin D deficiency  Will give 3 doses vit D3 once a week for 3 weeks then can take 2000 units daily  Hyperlipidemia   DASH diet

## 2014-07-22 LAB — ANA: Anti Nuclear Antibody(ANA): NEGATIVE

## 2014-07-25 ENCOUNTER — Telehealth: Payer: Self-pay | Admitting: Internal Medicine

## 2014-07-25 DIAGNOSIS — R7989 Other specified abnormal findings of blood chemistry: Secondary | ICD-10-CM

## 2014-07-25 DIAGNOSIS — R945 Abnormal results of liver function studies: Principal | ICD-10-CM

## 2014-07-25 NOTE — Telephone Encounter (Signed)
I left Heather [name] a message. She is set up for tomorrow for her U/S @ 8:30. She has to be NPO 6 hours prior. There is also a referral in Epic for [facility] -eh

## 2014-07-25 NOTE — Telephone Encounter (Signed)
Spoke with pt and informed of lfts.    Hepatitis and ANA negative.  Will set up ultrasound and refer to GI

## 2014-07-26 ENCOUNTER — Encounter: Payer: Self-pay | Admitting: Internal Medicine

## 2014-07-26 ENCOUNTER — Ambulatory Visit (HOSPITAL_BASED_OUTPATIENT_CLINIC_OR_DEPARTMENT_OTHER)
Admission: RE | Admit: 2014-07-26 | Discharge: 2014-07-26 | Disposition: A | Discharge status: Home / Self Care | Payer: BLUE CROSS/BLUE SHIELD | Source: Ambulatory Visit | Attending: Internal Medicine | Admitting: Internal Medicine

## 2014-07-26 DIAGNOSIS — R7989 Other specified abnormal findings of blood chemistry: Secondary | ICD-10-CM | POA: Insufficient documentation

## 2014-07-26 DIAGNOSIS — R945 Abnormal results of liver function studies: Secondary | ICD-10-CM

## 2014-07-27 ENCOUNTER — Telehealth: Payer: Self-pay | Admitting: Internal Medicine

## 2014-07-27 NOTE — Telephone Encounter (Signed)
Left message on mobile and home to call office  Monday for ultrasound results

## 2014-07-29 ENCOUNTER — Ambulatory Visit
Admission: RE | Admit: 2014-07-29 | Discharge: 2014-07-29 | Disposition: A | Discharge status: Home / Self Care | Payer: BLUE CROSS/BLUE SHIELD | Source: Ambulatory Visit | Attending: Internal Medicine | Admitting: Internal Medicine

## 2014-07-29 DIAGNOSIS — Z1231 Encounter for screening mammogram for malignant neoplasm of breast: Secondary | ICD-10-CM

## 2014-07-29 DIAGNOSIS — Z0000 Encounter for general adult medical examination without abnormal findings: Secondary | ICD-10-CM

## 2014-08-01 ENCOUNTER — Telehealth: Payer: Self-pay | Admitting: Internal Medicine

## 2014-08-01 NOTE — Telephone Encounter (Signed)
Spoke with pt and informed of ultrasound results.   Advised to be sure to keep appt with GI  Dr. Carlean Purl   Copy of ultrasound mailed to pt

## 2014-08-22 ENCOUNTER — Other Ambulatory Visit (INDEPENDENT_AMBULATORY_CARE_PROVIDER_SITE_OTHER): Payer: BLUE CROSS/BLUE SHIELD

## 2014-08-22 DIAGNOSIS — E559 Vitamin D deficiency, unspecified: Secondary | ICD-10-CM | POA: Diagnosis not present

## 2014-08-23 LAB — VITAMIN D 25 HYDROXY (VIT D DEFICIENCY, FRACTURES): VITD: 21.73 ng/mL — ABNORMAL LOW (ref 30.00–100.00)

## 2014-08-25 ENCOUNTER — Other Ambulatory Visit: Payer: Self-pay | Admitting: Internal Medicine

## 2014-08-25 ENCOUNTER — Encounter: Payer: Self-pay | Admitting: Internal Medicine

## 2014-08-25 DIAGNOSIS — E559 Vitamin D deficiency, unspecified: Secondary | ICD-10-CM

## 2014-08-25 MED ORDER — VITAMIN D (ERGOCALCIFEROL) 1.25 MG (50000 UNIT) PO CAPS: 50000.0000 [IU] | ORAL_CAPSULE | ORAL | Status: DC

## 2014-09-15 ENCOUNTER — Ambulatory Visit (INDEPENDENT_AMBULATORY_CARE_PROVIDER_SITE_OTHER): Payer: BLUE CROSS/BLUE SHIELD | Admitting: Internal Medicine

## 2014-09-15 ENCOUNTER — Encounter: Payer: Self-pay | Admitting: Internal Medicine

## 2014-09-15 ENCOUNTER — Other Ambulatory Visit (INDEPENDENT_AMBULATORY_CARE_PROVIDER_SITE_OTHER): Payer: BLUE CROSS/BLUE SHIELD

## 2014-09-15 VITALS — BP 124/82 | HR 76 | Ht 65.75 in | Wt 185.0 lb

## 2014-09-15 DIAGNOSIS — R740 Nonspecific elevation of levels of transaminase and lactic acid dehydrogenase [LDH]: Secondary | ICD-10-CM

## 2014-09-15 DIAGNOSIS — Z1211 Encounter for screening for malignant neoplasm of colon: Secondary | ICD-10-CM

## 2014-09-15 DIAGNOSIS — R748 Abnormal levels of other serum enzymes: Secondary | ICD-10-CM

## 2014-09-15 LAB — HEPATIC FUNCTION PANEL
ALT: 111 U/L — ABNORMAL HIGH (ref 0–35)
AST: 82 U/L — ABNORMAL HIGH (ref 0–37)
Albumin: 4.4 g/dL (ref 3.5–5.2)
Alkaline Phosphatase: 88 U/L (ref 39–117)
BILIRUBIN TOTAL: 0.7 mg/dL (ref 0.2–1.2)
Bilirubin, Direct: 0.1 mg/dL (ref 0.0–0.3)
Total Protein: 7.1 g/dL (ref 6.0–8.3)

## 2014-09-15 LAB — FERRITIN: FERRITIN: 160.1 ng/mL (ref 10.0–291.0)

## 2014-09-15 LAB — IGA: IGA: 496 mg/dL — AB (ref 68–378)

## 2014-09-15 NOTE — Patient Instructions (Signed)
You have been scheduled for a colonoscopy. Please follow written instructions given to you at your visit today.  Please pick up your prep supplies at the pharmacy within the next 1-3 days. If you use inhalers (even only as needed), please bring them with you on the day of your procedure. Your physician has requested that you go to www.startemmi.com and enter the access code given to you at your visit today. This web site gives a general overview about your procedure. However, you should still follow specific instructions given to you by our office regarding your preparation for the procedure.   Your physician has requested that you go to the basement for the lab work before leaving today.    I appreciate the opportunity to care for you. Silvano Rusk, M.D., [facility]

## 2014-09-15 NOTE — Progress Notes (Addendum)
Referred by: Lanice Shirts, MD [address], [place] [id number]  Subjective:    Patient ID: Heather [name], female    DOB: [birth year], 54 y.o.   MRN: [id number] Cc: abnormal liver tests HPI The patient is a very nice woman with asymptomatic elevated transaminases discovered by her PCP. Labs are reviewed in the EMR and for a few months she has had 1.5-2x abnormal transaminases with normal alk phos and bili. No excessive alcohol. Does seem to have some hx elevated LFT's in past attributed to statins.  07/26/2014 [place] abd shows increased echotexture of liver that is c/w fatty liver. No myalgias. No family hx liver dz.  ANA is negative.  She also has elevated calcium, PTH and low vit D which is being treated. Has seen Dr. Cruzita Lederer and plans for reassessment when vit D is NL are in the works. Patient ? If these abnormalities are related to abnormal transaminases. She remains concerned that she has not been tested adequately for theses problems.  Has a hx of hemorrhoids since pregnancy, not bleeding.  She is also ready to pursue a screening colonoscopy.  Allergies  Allergen Reactions  . Codeine Nausea And Vomiting    Severe Nausea & Vomiting; Pt cannot take percocet or vicocin   Outpatient Prescriptions Prior to Visit  Medication Sig Dispense Refill  . Vitamin D, Ergocalciferol, (DRISDOL) 50000 UNITS CAPS capsule Take 1 capsule (50,000 Units total) by mouth every 7 (seven) days. 8 capsule 1  . cholecalciferol (VITAMIN D) 1000 UNITS tablet Take 1,000 Units by mouth 2 (two) times daily.     No facility-administered medications prior to visit.   Past Medical History  Diagnosis Date  . Anemia     iron supp  . GERD (gastroesophageal reflux disease)     occasional related to foods, uses pepcid prn  . Kidney stones   . Pneumonia    Past Surgical History  Procedure Laterality Date  . Diagnostic laparoscopy  99    ectopic  . Tonsillectomy  as child  .  Laparoscopic assisted vaginal hysterectomy  04/15/2011    Procedure: LAPAROSCOPIC ASSISTED VAGINAL HYSTERECTOMY;  Surgeon: Darlyn Chamber;  Location: [facility];  Service: Gynecology;  Laterality: N/A;  . Abdominal hysterectomy     History   Social History  . Marital Status: Married    Spouse Name: N/A  . Number of Children: 3  . Years of Education: N/A   Occupational History  . Accountant    Social History Main Topics  . Smoking status: Never Smoker   . Smokeless tobacco: Never Used  . Alcohol Use: 0.0 oz/week    0 Standard drinks or equivalent per week     Comment: 1 glass of wine per week  . Drug Use: No  . Sexual Activity:    Partners: Male   Other Topics Concern  . None   Social History Narrative   Family History  Problem Relation Age of Onset  . Hypertension Mother   . Aneurysm Mother   . Hypertension Father   . Heart disease Father   . Breast cancer Maternal Aunt   . Diabetes Maternal Uncle   . Stroke Maternal Grandmother   . Colon cancer Neg Hx   . Colon polyps Mother   . Esophageal cancer Neg Hx   . Gallbladder disease Neg Hx   . Kidney disease Neg Hx     Review of Systems As per HPI all other negative  Objective:   Physical Exam _0  124/82 mmHg  Pulse 76  Ht 5' 5.75" (1.67 m)  Wt 185 lb (83.915 kg)  BMI 30.09 kg/m2  LMP 03/20/2011@  General:  Well-developed, well-nourished and in no acute distress Eyes:  anicteric. ENT:   Mouth and posterior pharynx free of lesions.  Neck:   supple w/o thyromegaly or mass.  Lungs: Clear to auscultation bilaterally. Heart:  S1S2, no rubs, murmurs, gallops. Abdomen: Obese  soft, non-tender, no hepatosplenomegaly, hernia, or mass and BS+.  Rectal: Deferred until colonoscopy Lymph:  no cervical or supraclavicular adenopathy. Extremities:   no edema, cyanosis or clubbing Skin   no rash.no signs chronic liver disease Neuro:  A&O x 3.  Psych:  appropriate mood and  Affect.   Data Reviewed: PCP notes,  Endocrinology notes Labs in EMR 2015 and 2016    Assessment & Plan:  Abnormal transaminases  Colon cancer screening    TTG Ab, IgA , LFT's, Ferritin will be checked re: abnormal transaminases. I told her fatty liver disease seemed most likely dx at this point. Biopsy of liver could be needed but would need to change therapy first - weight loss is mainstay. She is to avoid excessive EtOH - already does.  The risks and benefits as well as alternatives of endoscopic procedure(s) have been discussed and reviewed. All questions answered. The patient agrees to proceed. Colonoscopy for screening  I tried to reassure her that there is an appropriate and good plan in place re: increased PTH and hypercalcemia.  I appreciate the opportunity to care for this patient. XN:[id number],HQPRFF, MD Philemon Kingdom, MD

## 2014-09-16 LAB — TISSUE TRANSGLUTAMINASE, IGA: Tissue Transglutaminase Ab, IgA: 1 U/mL (ref <4)

## 2014-09-17 ENCOUNTER — Encounter: Payer: Self-pay | Admitting: Internal Medicine

## 2014-09-19 ENCOUNTER — Encounter: Payer: Self-pay | Admitting: Internal Medicine

## 2014-09-21 ENCOUNTER — Other Ambulatory Visit: Payer: Self-pay

## 2014-09-21 DIAGNOSIS — R768 Other specified abnormal immunological findings in serum: Secondary | ICD-10-CM

## 2014-09-21 DIAGNOSIS — R748 Abnormal levels of other serum enzymes: Secondary | ICD-10-CM

## 2014-09-21 NOTE — Progress Notes (Signed)
Quick Note:  Transaminases are a bit higher. She does not test positive for celiac disease. Her IgA level was elevated probably not an issue but needs a bit more looking into I recommend further testing with repeat AST and ALT, serum protein electrophoresis, IgG level IgM level use a diagnosis of abnormal transaminases and elevated IgA Please also do an anti-smooth muscle antibody diagnosis to be used as abnormal transaminases ______

## 2014-09-23 ENCOUNTER — Other Ambulatory Visit (INDEPENDENT_AMBULATORY_CARE_PROVIDER_SITE_OTHER): Payer: BLUE CROSS/BLUE SHIELD

## 2014-09-23 DIAGNOSIS — R768 Other specified abnormal immunological findings in serum: Secondary | ICD-10-CM

## 2014-09-23 DIAGNOSIS — R748 Abnormal levels of other serum enzymes: Secondary | ICD-10-CM | POA: Diagnosis not present

## 2014-09-23 DIAGNOSIS — R740 Nonspecific elevation of levels of transaminase and lactic acid dehydrogenase [LDH]: Secondary | ICD-10-CM

## 2014-09-23 LAB — AST: AST: 78 U/L — ABNORMAL HIGH (ref 0–37)

## 2014-09-23 LAB — ALT: ALT: 102 U/L — ABNORMAL HIGH (ref 0–35)

## 2014-09-24 LAB — IGM: IgM, Serum: 52 mg/dL (ref 52–322)

## 2014-09-24 LAB — IGG: IgG (Immunoglobin G), Serum: 983 mg/dL (ref 690–1700)

## 2014-09-27 LAB — ANTI-SMOOTH MUSCLE ANTIBODY, IGG: SMOOTH MUSCLE AB: 11 U (ref <20)

## 2014-09-28 LAB — PROTEIN ELECTROPHORESIS, SERUM
ALBUMIN ELP: 4 g/dL (ref 3.8–4.8)
Alpha-1-Globulin: 0.5 g/dL — ABNORMAL HIGH (ref 0.2–0.3)
Alpha-2-Globulin: 0.7 g/dL (ref 0.5–0.9)
BETA GLOBULIN: 0.5 g/dL (ref 0.4–0.6)
Beta 2: 0.4 g/dL (ref 0.2–0.5)
GAMMA GLOBULIN: 0.9 g/dL (ref 0.8–1.7)
Total Protein, Serum Electrophoresis: 6.9 g/dL (ref 6.1–8.1)

## 2014-09-30 NOTE — Progress Notes (Signed)
Quick Note:  Lab tests do not show a cause for liver test abnormalities Fatty liver leading candidate Ask her to repeat the LFT's in the week before 7/8 colonoscopy ______

## 2014-10-04 ENCOUNTER — Other Ambulatory Visit: Payer: Self-pay

## 2014-10-04 DIAGNOSIS — R945 Abnormal results of liver function studies: Principal | ICD-10-CM

## 2014-10-04 DIAGNOSIS — R7989 Other specified abnormal findings of blood chemistry: Secondary | ICD-10-CM

## 2014-10-07 ENCOUNTER — Ambulatory Visit: Payer: BLUE CROSS/BLUE SHIELD | Admitting: Internal Medicine

## 2014-11-04 ENCOUNTER — Ambulatory Visit (AMBULATORY_SURGERY_CENTER): Payer: BLUE CROSS/BLUE SHIELD | Admitting: Internal Medicine

## 2014-11-04 ENCOUNTER — Encounter: Payer: Self-pay | Admitting: Internal Medicine

## 2014-11-04 VITALS — BP 120/79 | HR 70 | Temp 97.9°F | Resp 16 | Ht 65.0 in | Wt 185.0 lb

## 2014-11-04 DIAGNOSIS — D120 Benign neoplasm of cecum: Secondary | ICD-10-CM

## 2014-11-04 DIAGNOSIS — Z1211 Encounter for screening for malignant neoplasm of colon: Secondary | ICD-10-CM | POA: Diagnosis present

## 2014-11-04 MED ORDER — SODIUM CHLORIDE 0.9 % IV SOLN: 500.0000 mL | INTRAVENOUS | Status: DC

## 2014-11-04 NOTE — Progress Notes (Signed)
Transferred to recovery room. A/O x3, pleased with MAC.  VSS.  Report to Suzanne, RN. 

## 2014-11-04 NOTE — Op Note (Signed)
[place]  [address] [place], [id number]   COLONOSCOPY PROCEDURE REPORT  PATIENT: Heather [name], Heather [name]  MR#: [id number] BIRTHDATE: 1961/02/11 , 53  yrs. old GENDER: female ENDOSCOPIST: Gatha Mayer, MD, [facility] PROCEDURE DATE:  11/04/2014 PROCEDURE:   Colonoscopy, screening and Colonoscopy with snare polypectomy First Screening Colonoscopy - Avg.  risk and is 50 yrs.  old or older Yes.  Prior Negative Screening - Now for repeat screening. N/A  History of Adenoma - Now for follow-up colonoscopy & has been > or = to 3 yrs.  N/A  Polyps removed today? Yes ASA CLASS:   Class I INDICATIONS:Screening for colonic neoplasia and Colorectal Neoplasm Risk Assessment for this procedure is average risk. MEDICATIONS: Propofol 250 mg IV and Monitored anesthesia care  DESCRIPTION OF PROCEDURE:   After the risks benefits and alternatives of the procedure were thoroughly explained, informed consent was obtained.  The digital rectal exam revealed no abnormalities of the rectum.   The LB [id number]  endoscope was introduced through the anus and advanced to the cecum, which was identified by both the appendix and ileocecal valve. No adverse events experienced.   The quality of the prep was excellent. (MiraLax was used)  The instrument was then slowly withdrawn as the colon was fully examined. Estimated blood loss is zero unless otherwise noted in this procedure report.      COLON FINDINGS: A sessile polyp measuring 7 mm in size was found at the cecum.  A polypectomy was performed with a cold snare.  The resection was complete, the polyp tissue was completely retrieved and sent to histology.   There was diverticulosis noted in the sigmoid colon.   The examination was otherwise normal.  Retroflexed views revealed no abnormalities. The time to cecum = 2.1 Withdrawal time = 11.9   The scope was withdrawn and the procedure completed. COMPLICATIONS: There were no immediate  complications.  ENDOSCOPIC IMPRESSION: 1.   Sessile polyp was found at the cecum; polypectomy was performed with a cold snare 2.   Diverticulosis was noted in the sigmoid colon 3.   The examination was otherwise normal - first screening  RECOMMENDATIONS: Timing of repeat colonoscopy will be determined by pathology findings.  eSigned:  Gatha Mayer, MD, [facility] 11/04/2014 2:30 PM   cc: The Patient

## 2014-11-04 NOTE — Patient Instructions (Addendum)
I found and removed one small polyp. It looks benign. You also have a condition called diverticulosis - common and not usually a problem. Please read the handout provided.  I will let you know pathology results and when to have another routine colonoscopy by mail.  I appreciate the opportunity to care for you. Gatha Mayer, MD, FACG  YOU HAD AN ENDOSCOPIC PROCEDURE TODAY AT [facility]:   Refer to the procedure report that was given to you for any specific questions about what was found during the examination.  If the procedure report does not answer your questions, please call your gastroenterologist to clarify.  If you requested that your care partner not be given the details of your procedure findings, then the procedure report has been included in a sealed envelope for you to review at your convenience later.  YOU SHOULD EXPECT: Some feelings of bloating in the abdomen. Passage of more gas than usual.  Walking can help get rid of the air that was put into your GI tract during the procedure and reduce the bloating. If you had a lower endoscopy (such as a colonoscopy or flexible sigmoidoscopy) you may notice spotting of blood in your stool or on the toilet paper. If you underwent a bowel prep for your procedure, you may not have a normal bowel movement for a few days.  Please Note:  You might notice some irritation and congestion in your nose or some drainage.  This is from the oxygen used during your procedure.  There is no need for concern and it should clear up in a day or so.  SYMPTOMS TO REPORT IMMEDIATELY:   Following lower endoscopy (colonoscopy or flexible sigmoidoscopy):  Excessive amounts of blood in the stool  Significant tenderness or worsening of abdominal pains  Swelling of the abdomen that is new, acute  Fever of 100F or higher   For urgent or emergent issues, a gastroenterologist can be reached at any hour by calling [phone].   DIET:  Your first meal following the procedure should be a small meal and then it is ok to progress to your normal diet. Heavy or fried foods are harder to digest and may make you feel nauseous or bloated.  Likewise, meals heavy in dairy and vegetables can increase bloating.  Drink plenty of fluids but you should avoid alcoholic beverages for 24 hours.  ACTIVITY:  You should plan to take it easy for the rest of today and you should NOT DRIVE or use heavy machinery until tomorrow (because of the sedation medicines used during the test).    FOLLOW UP: Our staff will call the number listed on your records the next business day following your procedure to check on you and address any questions or concerns that you may have regarding the information given to you following your procedure. If we do not reach you, we will leave a message.  However, if you are feeling well and you are not experiencing any problems, there is no need to return our call.  We will assume that you have returned to your regular daily activities without incident.   If any biopsies were taken you will be contacted by phone or by letter within the next 1-3 weeks.  Please call us at [phone] if you have not heard about the biopsies in 3 weeks.    SIGNATURES/CONFIDENTIALITY: You and/or your care partner have signed paperwork which will be entered into your electronic medical record.  These signatures attest to the fact that that the information above on your After Visit Summary has been reviewed and is understood.  Full responsibility of the confidentiality of this discharge information lies with you and/or your care-partner.  Read all handouts given to you by your recovery room nurse.

## 2014-11-04 NOTE — Progress Notes (Signed)
Called to room to assist during endoscopic procedure.  Patient ID and intended procedure confirmed with present staff. Received instructions for my participation in the procedure from the performing physician.  

## 2014-11-07 ENCOUNTER — Telehealth: Payer: Self-pay | Admitting: *Deleted

## 2014-11-07 NOTE — Telephone Encounter (Signed)
  Follow up Call-  Call back number 11/04/2014  Post procedure Call Back phone  # [phone]  Permission to leave phone message Yes     Patient questions:  Do you have a fever, pain , or abdominal swelling? No. Pain Score  0 *  Have you tolerated food without any problems? Yes.    Have you been able to return to your normal activities? Yes.    Do you have any questions about your discharge instructions: Diet   No. Medications  No. Follow up visit  No.  Do you have questions or concerns about your Care? No.  Actions: * If pain score is 4 or above: No action needed, pain <4.

## 2014-11-14 ENCOUNTER — Encounter: Payer: Self-pay | Admitting: Internal Medicine

## 2014-11-14 DIAGNOSIS — Z860100 Personal history of colon polyps, unspecified: Secondary | ICD-10-CM

## 2014-11-14 HISTORY — DX: Personal history of colon polyps, unspecified: Z86.0100

## 2014-11-14 HISTORY — DX: Personal history of colonic polyps: Z86.010

## 2014-11-14 NOTE — Progress Notes (Signed)
Quick Note:  7 mm ssp - repeat colon 2021 ______

## 2014-12-29 ENCOUNTER — Other Ambulatory Visit (INDEPENDENT_AMBULATORY_CARE_PROVIDER_SITE_OTHER): Payer: BLUE CROSS/BLUE SHIELD

## 2014-12-29 DIAGNOSIS — R7989 Other specified abnormal findings of blood chemistry: Secondary | ICD-10-CM

## 2014-12-29 DIAGNOSIS — R945 Abnormal results of liver function studies: Principal | ICD-10-CM

## 2014-12-29 LAB — HEPATIC FUNCTION PANEL
ALBUMIN: 4.3 g/dL (ref 3.5–5.2)
ALT: 76 U/L — ABNORMAL HIGH (ref 0–35)
AST: 60 U/L — ABNORMAL HIGH (ref 0–37)
Alkaline Phosphatase: 106 U/L (ref 39–117)
Bilirubin, Direct: 0.1 mg/dL (ref 0.0–0.3)
TOTAL PROTEIN: 7.5 g/dL (ref 6.0–8.3)
Total Bilirubin: 0.6 mg/dL (ref 0.2–1.2)

## 2014-12-29 NOTE — Progress Notes (Signed)
Quick Note:  LFT's Better - suspect fatty liver still Repeat LFT's 3 months please - weight loss is helpful, she should limit alcohol - strict avoidance best  ______

## 2014-12-30 ENCOUNTER — Other Ambulatory Visit: Payer: Self-pay

## 2014-12-30 DIAGNOSIS — R7989 Other specified abnormal findings of blood chemistry: Secondary | ICD-10-CM

## 2014-12-30 DIAGNOSIS — R945 Abnormal results of liver function studies: Principal | ICD-10-CM

## 2015-01-23 ENCOUNTER — Other Ambulatory Visit (HOSPITAL_COMMUNITY): Payer: Self-pay | Admitting: Internal Medicine

## 2015-01-23 DIAGNOSIS — E210 Primary hyperparathyroidism: Secondary | ICD-10-CM

## 2015-02-02 ENCOUNTER — Encounter (HOSPITAL_COMMUNITY)
Admission: RE | Admit: 2015-02-02 | Discharge: 2015-02-02 | Disposition: A | Discharge status: Home / Self Care | Payer: BLUE CROSS/BLUE SHIELD | Source: Ambulatory Visit | Attending: Internal Medicine | Admitting: Internal Medicine

## 2015-02-02 ENCOUNTER — Encounter (HOSPITAL_COMMUNITY): Payer: BLUE CROSS/BLUE SHIELD

## 2015-02-02 DIAGNOSIS — E210 Primary hyperparathyroidism: Secondary | ICD-10-CM | POA: Diagnosis present

## 2015-02-02 DIAGNOSIS — D351 Benign neoplasm of parathyroid gland: Secondary | ICD-10-CM | POA: Diagnosis not present

## 2015-02-02 DIAGNOSIS — Z87442 Personal history of urinary calculi: Secondary | ICD-10-CM | POA: Diagnosis not present

## 2015-02-02 MED ORDER — TECHNETIUM TC 99M SESTAMIBI - CARDIOLITE
26.7000 | Freq: Once | INTRAVENOUS | Status: AC | PRN
  Administered 2015-02-02: 27 via INTRAVENOUS

## 2015-03-17 ENCOUNTER — Ambulatory Visit: Payer: Self-pay | Admitting: Surgery

## 2015-04-03 NOTE — Patient Instructions (Addendum)
YOUR PROCEDURE IS SCHEDULED ON : 04/06/15  REPORT TO [facility] MAIN ENTRANCE FOLLOW SIGNS TO EAST ELEVATOR - GO TO 3rd FLOOR CHECK IN AT 3 EAST NURSES STATION (SHORT STAY) AT:  7:30 AM  CALL THIS NUMBER IF YOU HAVE PROBLEMS THE MORNING OF SURGERY [phone]  REMEMBER:ONLY 1 PER PERSON MAY GO TO SHORT STAY WITH YOU TO GET READY THE MORNING OF YOUR SURGERY  DO NOT EAT FOOD OR DRINK LIQUIDS AFTER MIDNIGHT  TAKE THESE MEDICINES THE MORNING OF SURGERY: none  STOP ASPIRIN / IBUPROFEN / ALEVE / VITAMINS / HERBAL MEDS __5__ DAYS BEFORE SURGERY  YOU MAY NOT HAVE ANY METAL ON YOUR BODY INCLUDING HAIR PINS AND PIERCING'S. DO NOT WEAR JEWELRY, MAKEUP, LOTIONS, POWDERS OR PERFUMES. DO NOT WEAR NAIL POLISH. DO NOT SHAVE 48 HRS PRIOR TO SURGERY. MEN MAY SHAVE FACE AND NECK.  DO NOT [place]. [facility] IS NOT RESPONSIBLE FOR VALUABLES.  CONTACTS, DENTURES OR PARTIALS MAY NOT BE WORN TO SURGERY. LEAVE SUITCASE IN CAR. CAN BE BROUGHT TO ROOM AFTER SURGERY.  PATIENTS DISCHARGED THE DAY OF SURGERY WILL NOT BE ALLOWED TO DRIVE HOME.  PLEASE READ OVER THE FOLLOWING INSTRUCTION SHEETS _________________________________________________________________________________                                          [facility] - PREPARING FOR SURGERY  Before surgery, you can play an important role.  Because skin is not sterile, your skin needs to be as free of germs as possible.  You can reduce the number of germs on your skin by washing with CHG (chlorahexidine gluconate) soap before surgery.  CHG is an antiseptic cleaner which kills germs and bonds with the skin to continue killing germs even after washing. Please DO NOT use if you have an allergy to CHG or antibacterial soaps.  If your skin becomes reddened/irritated stop using the CHG and inform your nurse when you arrive at Short Stay. Do not shave (including legs and underarms) for at least 48 hours prior to the  first CHG shower.  You may shave your face. Please follow these instructions carefully:   1.  Shower with CHG Soap the night before surgery and the  morning of Surgery.   2.  If you choose to wash your hair, wash your hair first as usual with your  normal  Shampoo.   3.  After you shampoo, rinse your hair and body thoroughly to remove the  shampoo.                                         4.  Use CHG as you would any other liquid soap.  You can apply chg directly  to the skin and wash . Gently wash with scrungie or clean wascloth    5.  Apply the CHG Soap to your body ONLY FROM THE NECK DOWN.   Do not use on open                           Wound or open sores. Avoid contact with eyes, ears mouth and genitals (private parts).  Genitals (private parts) with your normal soap.              6.  Wash thoroughly, paying special attention to the area where your surgery  will be performed.   7.  Thoroughly rinse your body with warm water from the neck down.   8.  DO NOT shower/wash with your normal soap after using and rinsing off  the CHG Soap .                9.  Pat yourself dry with a clean towel.             10.  Wear clean night clothes to bed after shower             11.  Place clean sheets on your bed the night of your first shower and do not  sleep with pets.  Day of Surgery : Do not apply any lotions/deodorants the morning of surgery.  Please wear clean clothes to the hospital/surgery center.  FAILURE TO FOLLOW THESE INSTRUCTIONS MAY RESULT IN THE CANCELLATION OF YOUR SURGERY    PATIENT SIGNATURE_________________________________  ______________________________________________________________________

## 2015-04-04 ENCOUNTER — Encounter (HOSPITAL_COMMUNITY)
Admission: RE | Admit: 2015-04-04 | Discharge: 2015-04-04 | Disposition: A | Discharge status: Home / Self Care | Payer: BLUE CROSS/BLUE SHIELD | Source: Ambulatory Visit | Attending: Surgery | Admitting: Surgery

## 2015-04-04 ENCOUNTER — Encounter (HOSPITAL_COMMUNITY): Payer: Self-pay

## 2015-04-04 DIAGNOSIS — Z87442 Personal history of urinary calculi: Secondary | ICD-10-CM | POA: Diagnosis not present

## 2015-04-04 DIAGNOSIS — Z90711 Acquired absence of uterus with remaining cervical stump: Secondary | ICD-10-CM | POA: Diagnosis not present

## 2015-04-04 DIAGNOSIS — E210 Primary hyperparathyroidism: Secondary | ICD-10-CM | POA: Diagnosis not present

## 2015-04-04 DIAGNOSIS — M8580 Other specified disorders of bone density and structure, unspecified site: Secondary | ICD-10-CM | POA: Diagnosis not present

## 2015-04-04 DIAGNOSIS — Z79899 Other long term (current) drug therapy: Secondary | ICD-10-CM | POA: Diagnosis not present

## 2015-04-04 DIAGNOSIS — K219 Gastro-esophageal reflux disease without esophagitis: Secondary | ICD-10-CM | POA: Diagnosis not present

## 2015-04-04 HISTORY — DX: Reserved for concepts with insufficient information to code with codable children: IMO0002

## 2015-04-04 HISTORY — DX: Abnormal levels of other serum enzymes: R74.8

## 2015-04-04 HISTORY — DX: Headache, unspecified: R51.9

## 2015-04-04 HISTORY — DX: Personal history of urinary calculi: Z87.442

## 2015-04-04 HISTORY — DX: Other specified disorders of bone density and structure, unspecified site: M85.80

## 2015-04-04 HISTORY — DX: Headache: R51

## 2015-04-04 HISTORY — DX: Hypoparathyroidism, unspecified: E20.9

## 2015-04-04 LAB — COMPREHENSIVE METABOLIC PANEL WITH GFR
ALT: 65 U/L — ABNORMAL HIGH (ref 14–54)
AST: 53 U/L — ABNORMAL HIGH (ref 15–41)
Albumin: 4.2 g/dL (ref 3.5–5.0)
Alkaline Phosphatase: 98 U/L (ref 38–126)
Anion gap: 5 (ref 5–15)
BUN: 12 mg/dL (ref 6–20)
CO2: 27 mmol/L (ref 22–32)
Calcium: 10.4 mg/dL — ABNORMAL HIGH (ref 8.9–10.3)
Chloride: 105 mmol/L (ref 101–111)
Creatinine, Ser: 0.72 mg/dL (ref 0.44–1.00)
GFR calc Af Amer: >60 mL/min (ref >60)
GFR calc non Af Amer: >60 mL/min (ref >60)
Glucose, Bld: 87 mg/dL (ref 65–99)
Potassium: 4.7 mmol/L (ref 3.5–5.1)
Sodium: 137 mmol/L (ref 135–145)
Total Bilirubin: 0.6 mg/dL (ref 0.3–1.2)
Total Protein: 7.5 g/dL (ref 6.5–8.1)

## 2015-04-04 LAB — CBC
HCT: 42.8 % (ref 36.0–46.0)
Hemoglobin: 14.1 g/dL (ref 12.0–15.0)
MCH: 29.7 pg (ref 26.0–34.0)
MCHC: 32.9 g/dL (ref 30.0–36.0)
MCV: 90.3 fL (ref 78.0–100.0)
Platelets: 257 K/uL (ref 150–400)
RBC: 4.74 MIL/uL (ref 3.87–5.11)
RDW: 12.6 % (ref 11.5–15.5)
WBC: 6.1 K/uL (ref 4.0–10.5)

## 2015-04-05 ENCOUNTER — Encounter (HOSPITAL_COMMUNITY): Payer: Self-pay | Admitting: Surgery

## 2015-04-05 DIAGNOSIS — E210 Primary hyperparathyroidism: Secondary | ICD-10-CM | POA: Diagnosis present

## 2015-04-05 NOTE — H&P (Signed)
General Surgery [facility]  Heather [name] DOB: 1961/03/22 Married / Language: Heather [name] / Race: White Female  History of Present Illness The patient is a 54 year old female who presents with a parathyroid neoplasm.  Patient is referred by Dr. Delrae Rend for evaluation of newly diagnosed primary hyperparathyroidism. Patient presented with approximately one-year history of fatigue. She has a history of nephrolithiasis 4 years ago. She suffered an ankle fracture earlier this year. Bone density scan was obtained and showed osteopenia. Patient did have low vitamin D levels but is not currently under treatment. Laboratory studies show elevated serum calcium level of 11.7 with an intact PTH level of 65. 24-hour urine calcium collection was normal at 140. Patient underwent nuclear medicine parathyroid scan on February 02, 2015. This localized an adenoma to the right superior position. The patient has had no prior surgery on the head or neck other than tonsillectomy. There is no family history of parathyroid disease. There is no family history of other endocrine neoplasms.  Other Problems Gastroesophageal Reflux Disease Kidney Stone Migraine Headache  Past Surgical History Colon Polyp Removal - Colonoscopy Hysterectomy (not due to cancer) - Partial Oral Surgery  Diagnostic Studies History Colonoscopy within last year Mammogram within last year Pap Smear >5 years ago  Allergies Codeine Sulfate *ANALGESICS - OPIOID* Nausea, Vomiting.  Medication History Ibuprofen (200MG  Tablet, Oral) Active. Medications Reconciled  Social History Alcohol use Occasional alcohol use. Caffeine use Tea. No drug use Tobacco use Never smoker.  Family History Arthritis Father, Mother. Colon Polyps Mother. Heart Disease Father, Mother. Heart disease in female family member before age 76 Heart disease in female family member before age 10 Hypertension  Father, Mother. Ischemic Bowel Disease Mother.  Pregnancy / Birth History Age at menarche 39 years. Contraceptive History Oral contraceptives. Gravida 5 Maternal age 40-30 Para 3  Review of Systems General Present- Fatigue and Weight Gain. Not Present- Appetite Loss, Chills, Fever, Night Sweats and Weight Loss. Skin Not Present- Change in Wart/Mole, Dryness, Hives, Jaundice, New Lesions, Non-Healing Wounds, Rash and Ulcer. HEENT Present- Wears glasses/contact lenses. Not Present- Earache, Hearing Loss, Hoarseness, Nose Bleed, Oral Ulcers, Ringing in the Ears, Seasonal Allergies, Sinus Pain, Sore Throat, Visual Disturbances and Yellow Eyes. Respiratory Not Present- Bloody sputum, Chronic Cough, Difficulty Breathing, Snoring and Wheezing. Breast Not Present- Breast Mass, Breast Pain, Nipple Discharge and Skin Changes. Cardiovascular Present- Swelling of Extremities. Not Present- Chest Pain, Difficulty Breathing Lying Down, Leg Cramps, Palpitations, Rapid Heart Rate and Shortness of Breath. Gastrointestinal Present- Bloating. Not Present- Abdominal Pain, Bloody Stool, Change in Bowel Habits, Chronic diarrhea, Constipation, Difficulty Swallowing, Excessive gas, Gets full quickly at meals, Hemorrhoids, Indigestion, Nausea, Rectal Pain and Vomiting. Female Genitourinary Not Present- Frequency, Nocturia, Painful Urination, Pelvic Pain and Urgency. Musculoskeletal Not Present- Back Pain, Joint Pain, Joint Stiffness, Muscle Pain, Muscle Weakness and Swelling of Extremities. Neurological Present- Headaches. Not Present- Decreased Memory, Fainting, Numbness, Seizures, Tingling, Tremor, Trouble walking and Weakness. Psychiatric Not Present- Anxiety, Bipolar, Change in Sleep Pattern, Depression, Fearful and Frequent crying. Endocrine Not Present- Cold Intolerance, Excessive Hunger, Hair Changes, Heat Intolerance, Hot flashes and New Diabetes. Hematology Not Present- Easy Bruising, Excessive bleeding,  Gland problems, HIV and Persistent Infections.  Vitals Weight: 185 lb Height: 65in Body Surface Area: 1.91 m Body Mass Index: 30.79 kg/m  Temp.: 97.46F(Temporal)  Pulse: 70 (Regular)  BP: 126/72 (Sitting, Left Arm, Standard)  Physical Exam  General - appears comfortable, no distress; not diaphorectic  HEENT - normocephalic;  sclerae clear, gaze conjugate; mucous membranes moist, dentition good; voice normal  Neck - symmetric on extension; no palpable anterior or posterior cervical adenopathy; no palpable masses in the thyroid bed  Chest - clear bilaterally without rhonchi, rales, or wheeze  Cor - regular rhythm with normal rate; no significant murmur  Ext - non-tender without significant edema or lymphedema  Neuro - grossly intact; no tremor    Assessment & Plan  PRIMARY HYPERPARATHYROIDISM (E21.0)  Patient presents on referral from her endocrinologist with signs and symptoms of primary hyperparathyroidism. I provided her with literature on parathyroid disease to review at home.  I have recommended proceeding with minimally invasive parathyroidectomy. We have discussed risk and benefits of the procedure. We have discussed alternative procedures such as neck exploration and identification of the parathyroid glands. We have discussed the use of intraoperative PTH monitoring. After discussion, the patient agrees to proceed with minimally invasive parathyroidectomy without intraoperative PTH monitoring. I believe the patient will be a good candidate for outpatient surgery.  The risks and benefits of the procedure have been discussed at length with the patient. The patient understands the proposed procedure, potential alternative treatments, and the course of recovery to be expected. All of the patient's questions have been answered at this time. The patient wishes to proceed with surgery.  Heather Regal, MD, [facility] Office: [phone]

## 2015-04-06 ENCOUNTER — Ambulatory Visit (HOSPITAL_COMMUNITY)
Admission: RE | Admit: 2015-04-06 | Discharge: 2015-04-06 | Disposition: A | Discharge status: Home / Self Care | Payer: BLUE CROSS/BLUE SHIELD | Source: Ambulatory Visit | Attending: Surgery | Admitting: Surgery

## 2015-04-06 ENCOUNTER — Ambulatory Visit (HOSPITAL_COMMUNITY): Payer: BLUE CROSS/BLUE SHIELD | Admitting: Registered Nurse

## 2015-04-06 ENCOUNTER — Encounter (HOSPITAL_COMMUNITY): Payer: Self-pay | Admitting: *Deleted

## 2015-04-06 ENCOUNTER — Encounter (HOSPITAL_COMMUNITY)
Admission: RE | Disposition: A | Discharge status: Home / Self Care | Payer: Self-pay | Source: Ambulatory Visit | Attending: Surgery

## 2015-04-06 DIAGNOSIS — E210 Primary hyperparathyroidism: Secondary | ICD-10-CM | POA: Insufficient documentation

## 2015-04-06 DIAGNOSIS — Z90711 Acquired absence of uterus with remaining cervical stump: Secondary | ICD-10-CM | POA: Insufficient documentation

## 2015-04-06 DIAGNOSIS — K219 Gastro-esophageal reflux disease without esophagitis: Secondary | ICD-10-CM | POA: Insufficient documentation

## 2015-04-06 DIAGNOSIS — M8580 Other specified disorders of bone density and structure, unspecified site: Secondary | ICD-10-CM | POA: Insufficient documentation

## 2015-04-06 DIAGNOSIS — Z79899 Other long term (current) drug therapy: Secondary | ICD-10-CM | POA: Insufficient documentation

## 2015-04-06 DIAGNOSIS — Z87442 Personal history of urinary calculi: Secondary | ICD-10-CM | POA: Insufficient documentation

## 2015-04-06 HISTORY — PX: PARATHYROIDECTOMY: SHX19

## 2015-04-06 SURGERY — PARATHYROIDECTOMY
Anesthesia: General | Site: Neck | Laterality: Right

## 2015-04-06 MED ORDER — ONDANSETRON HCL 4 MG/2ML IJ SOLN
INTRAMUSCULAR | Status: DC | PRN
  Administered 2015-04-06: 4 mg via INTRAVENOUS

## 2015-04-06 MED ORDER — LACTATED RINGERS IV SOLN
INTRAVENOUS | Status: DC | PRN
  Administered 2015-04-06 (×2): via INTRAVENOUS

## 2015-04-06 MED ORDER — PROPOFOL 10 MG/ML IV BOLUS
INTRAVENOUS | Status: DC | PRN
  Administered 2015-04-06: 180 mg via INTRAVENOUS

## 2015-04-06 MED ORDER — 0.9 % SODIUM CHLORIDE (POUR BTL) OPTIME
TOPICAL | Status: DC | PRN
  Administered 2015-04-06: 1000 mL

## 2015-04-06 MED ORDER — BUPIVACAINE-EPINEPHRINE (PF) 0.25% -1:200000 IJ SOLN
INTRAMUSCULAR | Status: DC | PRN
  Administered 2015-04-06: 10 mL

## 2015-04-06 MED ORDER — FENTANYL CITRATE (PF) 100 MCG/2ML IJ SOLN
INTRAMUSCULAR | Status: DC | PRN
  Administered 2015-04-06: 50 ug via INTRAVENOUS
  Administered 2015-04-06 (×2): 100 ug via INTRAVENOUS

## 2015-04-06 MED ORDER — FENTANYL CITRATE (PF) 100 MCG/2ML IJ SOLN: 25.0000 ug | INTRAMUSCULAR | Status: DC | PRN

## 2015-04-06 MED ORDER — DEXAMETHASONE SODIUM PHOSPHATE 10 MG/ML IJ SOLN
INTRAMUSCULAR | Status: AC
  Filled 2015-04-06: qty 1

## 2015-04-06 MED ORDER — ROCURONIUM BROMIDE 100 MG/10ML IV SOLN
INTRAVENOUS | Status: AC
  Filled 2015-04-06: qty 1

## 2015-04-06 MED ORDER — DEXAMETHASONE SODIUM PHOSPHATE 10 MG/ML IJ SOLN
INTRAMUSCULAR | Status: DC | PRN
  Administered 2015-04-06: 10 mg via INTRAVENOUS

## 2015-04-06 MED ORDER — LIDOCAINE HCL (CARDIAC) 20 MG/ML IV SOLN
INTRAVENOUS | Status: AC
  Filled 2015-04-06: qty 5

## 2015-04-06 MED ORDER — SCOPOLAMINE 1 MG/3DAYS TD PT72
MEDICATED_PATCH | TRANSDERMAL | Status: AC
  Filled 2015-04-06: qty 1

## 2015-04-06 MED ORDER — ONDANSETRON HCL 4 MG/2ML IJ SOLN
INTRAMUSCULAR | Status: AC
  Filled 2015-04-06: qty 2

## 2015-04-06 MED ORDER — TRAMADOL HCL 50 MG PO TABS
50.0000 mg | ORAL_TABLET | Freq: Once | ORAL | Status: AC
  Administered 2015-04-06: 50 mg via ORAL
  Filled 2015-04-06: qty 1

## 2015-04-06 MED ORDER — SUCCINYLCHOLINE CHLORIDE 20 MG/ML IJ SOLN
INTRAMUSCULAR | Status: DC | PRN
  Administered 2015-04-06: 100 mg via INTRAVENOUS

## 2015-04-06 MED ORDER — MIDAZOLAM HCL 5 MG/5ML IJ SOLN
INTRAMUSCULAR | Status: DC | PRN
  Administered 2015-04-06: 2 mg via INTRAVENOUS

## 2015-04-06 MED ORDER — SUGAMMADEX SODIUM 200 MG/2ML IV SOLN
INTRAVENOUS | Status: DC | PRN
  Administered 2015-04-06: 175 mg via INTRAVENOUS

## 2015-04-06 MED ORDER — MIDAZOLAM HCL 2 MG/2ML IJ SOLN
INTRAMUSCULAR | Status: AC
  Filled 2015-04-06: qty 2

## 2015-04-06 MED ORDER — CEFAZOLIN SODIUM-DEXTROSE 2-3 GM-% IV SOLR
INTRAVENOUS | Status: AC
  Filled 2015-04-06: qty 50

## 2015-04-06 MED ORDER — PROPOFOL 500 MG/50ML IV EMUL
INTRAVENOUS | Status: DC | PRN
  Administered 2015-04-06: 140 ug/kg/min via INTRAVENOUS

## 2015-04-06 MED ORDER — SCOPOLAMINE 1 MG/3DAYS TD PT72
MEDICATED_PATCH | TRANSDERMAL | Status: DC | PRN
  Administered 2015-04-06: 1 via TRANSDERMAL

## 2015-04-06 MED ORDER — TRAMADOL HCL 50 MG PO TABS: 50.0000 mg | ORAL_TABLET | Freq: Four times a day (QID) | ORAL | Status: DC | PRN

## 2015-04-06 MED ORDER — METOCLOPRAMIDE HCL 5 MG/ML IJ SOLN
INTRAMUSCULAR | Status: DC | PRN
  Administered 2015-04-06: 10 mg via INTRAVENOUS

## 2015-04-06 MED ORDER — CEFAZOLIN SODIUM-DEXTROSE 2-3 GM-% IV SOLR
2.0000 g | INTRAVENOUS | Status: AC
  Administered 2015-04-06: 2 g via INTRAVENOUS

## 2015-04-06 MED ORDER — METOCLOPRAMIDE HCL 5 MG/ML IJ SOLN
INTRAMUSCULAR | Status: AC
  Filled 2015-04-06: qty 2

## 2015-04-06 MED ORDER — SUGAMMADEX SODIUM 200 MG/2ML IV SOLN
INTRAVENOUS | Status: AC
  Filled 2015-04-06: qty 2

## 2015-04-06 MED ORDER — PROPOFOL 10 MG/ML IV BOLUS
INTRAVENOUS | Status: AC
  Filled 2015-04-06: qty 40

## 2015-04-06 MED ORDER — BUPIVACAINE-EPINEPHRINE (PF) 0.25% -1:200000 IJ SOLN
INTRAMUSCULAR | Status: AC
  Filled 2015-04-06: qty 30

## 2015-04-06 MED ORDER — LIDOCAINE HCL (CARDIAC) 20 MG/ML IV SOLN
INTRAVENOUS | Status: DC | PRN
  Administered 2015-04-06: 25 mg via INTRATRACHEAL
  Administered 2015-04-06: 75 mg via INTRAVENOUS

## 2015-04-06 MED ORDER — ROCURONIUM BROMIDE 100 MG/10ML IV SOLN
INTRAVENOUS | Status: DC | PRN
  Administered 2015-04-06: 10 mg via INTRAVENOUS
  Administered 2015-04-06: 30 mg via INTRAVENOUS

## 2015-04-06 MED ORDER — PROPOFOL 10 MG/ML IV BOLUS
INTRAVENOUS | Status: AC
  Filled 2015-04-06: qty 20

## 2015-04-06 MED ORDER — LACTATED RINGERS IV SOLN
INTRAVENOUS | Status: DC
  Administered 2015-04-06: 1000 mL via INTRAVENOUS

## 2015-04-06 MED ORDER — LABETALOL HCL 5 MG/ML IV SOLN
INTRAVENOUS | Status: DC | PRN
  Administered 2015-04-06: 2.5 mg via INTRAVENOUS

## 2015-04-06 MED ORDER — FENTANYL CITRATE (PF) 250 MCG/5ML IJ SOLN
INTRAMUSCULAR | Status: AC
  Filled 2015-04-06: qty 5

## 2015-04-06 MED ORDER — LACTATED RINGERS IV SOLN: INTRAVENOUS | Status: DC

## 2015-04-06 SURGICAL SUPPLY — 36 items
ATTRACTOMAT 16X20 MAGNETIC DRP (DRAPES) ×3 IMPLANT
BENZOIN TINCTURE PRP APPL 2/3 (GAUZE/BANDAGES/DRESSINGS) IMPLANT
BLADE HEX COATED 2.75 (ELECTRODE) ×3 IMPLANT
BLADE SURG 15 STRL LF DISP TIS (BLADE) ×1 IMPLANT
BLADE SURG 15 STRL SS (BLADE) ×2
CHLORAPREP W/TINT 26ML (MISCELLANEOUS) ×3 IMPLANT
CLIP TI MEDIUM 6 (CLIP) ×6 IMPLANT
CLIP TI WIDE RED SMALL 6 (CLIP) ×6 IMPLANT
CLOSURE WOUND 1/2 X4 (GAUZE/BANDAGES/DRESSINGS)
COVER SURGICAL LIGHT HANDLE (MISCELLANEOUS) ×3 IMPLANT
DRAPE LAPAROTOMY T 98X78 PEDS (DRAPES) ×3 IMPLANT
DRESSING SURGICEL FIBRLLR 1X2 (HEMOSTASIS) ×1 IMPLANT
DRSG SURGICEL FIBRILLAR 1X2 (HEMOSTASIS) ×3
ELECT PENCIL ROCKER SW 15FT (MISCELLANEOUS) ×3 IMPLANT
ELECT REM PT RETURN 9FT ADLT (ELECTROSURGICAL) ×3
ELECTRODE REM PT RTRN 9FT ADLT (ELECTROSURGICAL) ×1 IMPLANT
GAUZE SPONGE 4X4 16PLY XRAY LF (GAUZE/BANDAGES/DRESSINGS) ×3 IMPLANT
GLOVE SURG ORTHO 8.0 STRL STRW (GLOVE) ×3 IMPLANT
GOWN STRL REUS W/TWL XL LVL3 (GOWN DISPOSABLE) ×9 IMPLANT
KIT BASIN OR (CUSTOM PROCEDURE TRAY) ×3 IMPLANT
LIQUID BAND (GAUZE/BANDAGES/DRESSINGS) IMPLANT
NEEDLE HYPO 25X1 1.5 SAFETY (NEEDLE) ×3 IMPLANT
PACK BASIC VI WITH GOWN DISP (CUSTOM PROCEDURE TRAY) ×3 IMPLANT
STAPLER VISISTAT 35W (STAPLE) ×3 IMPLANT
STRIP CLOSURE SKIN 1/2X4 (GAUZE/BANDAGES/DRESSINGS) IMPLANT
SUT MNCRL AB 4-0 PS2 18 (SUTURE) ×3 IMPLANT
SUT SILK 2 0 (SUTURE)
SUT SILK 2-0 18XBRD TIE 12 (SUTURE) IMPLANT
SUT SILK 3 0 (SUTURE)
SUT SILK 3-0 18XBRD TIE 12 (SUTURE) IMPLANT
SUT VIC AB 3-0 SH 18 (SUTURE) ×3 IMPLANT
SYR BULB IRRIGATION 50ML (SYRINGE) ×3 IMPLANT
SYR CONTROL 10ML LL (SYRINGE) ×3 IMPLANT
TOWEL OR 17X26 10 PK STRL BLUE (TOWEL DISPOSABLE) ×3 IMPLANT
TOWEL OR NON WOVEN STRL DISP B (DISPOSABLE) ×3 IMPLANT
YANKAUER SUCT BULB TIP 10FT TU (MISCELLANEOUS) ×3 IMPLANT

## 2015-04-06 NOTE — Transfer of Care (Signed)
Immediate Anesthesia Transfer of Care Note  Patient: Heather [name]  Procedure(s) Performed: Procedure(s): PARATHYROIDECTOMY (Right)  Patient Location: PACU  Anesthesia Type:General  Level of Consciousness: awake, oriented and patient cooperative  Airway & Oxygen Therapy: Patient Spontanous Breathing and Patient connected to face mask oxygen  Post-op Assessment: Report given to RN and Post -op Vital signs reviewed and stable  Post vital signs: Reviewed and stable  Last Vitals: There were no vitals filed for this visit.  Complications: No apparent anesthesia complications

## 2015-04-06 NOTE — Interval H&P Note (Signed)
History and Physical Interval Note:  04/06/2015 9:31 AM  Heather [name]  has presented today for surgery, with the diagnosis of PRIMARY HYPERPARTHROID.  The various methods of treatment have been discussed with the patient and family. After consideration of risks, benefits and other options for treatment, the patient has consented to    Procedure(s): PARATHYROIDECTOMY (N/A) as a surgical intervention .    The patient's history has been reviewed, patient examined, no change in status, stable for surgery.  I have reviewed the patient's chart and labs.  Questions were answered to the patient's satisfaction.    Earnstine Regal, MD, [facility] Office: [place]

## 2015-04-06 NOTE — Anesthesia Postprocedure Evaluation (Signed)
Anesthesia Post Note  Patient: Heather [name]  Procedure(s) Performed: Procedure(s) (LRB): PARATHYROIDECTOMY (Right)  Patient location during evaluation: PACU Anesthesia Type: General Level of consciousness: awake and alert Pain management: pain level controlled Vital Signs Assessment: post-procedure vital signs reviewed and stable Respiratory status: spontaneous breathing, nonlabored ventilation, respiratory function stable and patient connected to nasal cannula oxygen Cardiovascular status: blood pressure returned to baseline and stable Postop Assessment: no signs of nausea or vomiting Anesthetic complications: no    Last Vitals:  Filed Vitals:   04/06/15 1145 04/06/15 1315  BP: 135/71 151/85  Pulse: 72 69  Temp: 36.6 C 36.6 C  Resp: 16 16    Last Pain:  Filed Vitals:   04/06/15 1334  PainSc: 2                  Dickson Kostelnik L

## 2015-04-06 NOTE — Anesthesia Preprocedure Evaluation (Signed)
Anesthesia Evaluation  Patient identified by MRN, date of birth, ID band Patient awake    Reviewed: Allergy & Precautions, H&P , NPO status , Patient's Chart, lab work & pertinent test results  Airway Mallampati: I  TM Distance: >3 FB Neck ROM: full    Dental no notable dental hx. (+) Teeth Intact, Dental Advisory Given   Pulmonary neg pulmonary ROS,    Pulmonary exam normal breath sounds clear to auscultation       Cardiovascular Exercise Tolerance: Good negative cardio ROS Normal cardiovascular exam Rhythm:regular Rate:Normal     Neuro/Psych vertigo negative neurological ROS  negative psych ROS   GI/Hepatic negative GI ROS, Neg liver ROS, GERD  Medicated and Controlled,  Endo/Other  negative endocrine ROShyperparathyroidism  Renal/GU negative Renal ROS  negative genitourinary   Musculoskeletal negative musculoskeletal ROS (+)   Abdominal Normal abdominal exam  (+)   Peds negative pediatric ROS (+)  Hematology negative hematology ROS (+)   Anesthesia Other Findings   Reproductive/Obstetrics negative OB ROS                             Anesthesia Physical Anesthesia Plan  ASA: II  Anesthesia Plan: General   Post-op Pain Management:    Induction: Intravenous  Airway Management Planned: Oral ETT  Additional Equipment:   Intra-op Plan:   Post-operative Plan: Extubation in OR  Informed Consent: I have reviewed the patients History and Physical, chart, labs and discussed the procedure including the risks, benefits and alternatives for the proposed anesthesia with the patient or authorized representative who has indicated his/her understanding and acceptance.   Dental Advisory Given  Plan Discussed with: CRNA and Surgeon  Anesthesia Plan Comments:         Anesthesia Quick Evaluation

## 2015-04-06 NOTE — Anesthesia Procedure Notes (Signed)
Procedure Name: Intubation Date/Time: 04/06/2015 9:56 AM Performed by: Lissa Morales Pre-anesthesia Checklist: Patient identified, Emergency Drugs available, Suction available and Patient being monitored Patient Re-evaluated:Patient Re-evaluated prior to inductionOxygen Delivery Method: Circle system utilized Preoxygenation: Pre-oxygenation with 100% oxygen Intubation Type: IV induction Ventilation: Mask ventilation without difficulty Laryngoscope Size: Mac and 2 Grade View: Grade I Tube type: Oral Tube size: 7.0 mm Number of attempts: 1 Airway Equipment and Method: Stylet Placement Confirmation: ETT inserted through vocal cords under direct vision,  positive ETCO2 and breath sounds checked- equal and bilateral Secured at: 24 cm Tube secured with: Tape Dental Injury: Teeth and Oropharynx as per pre-operative assessment

## 2015-04-06 NOTE — Discharge Instructions (Signed)

## 2015-04-06 NOTE — Op Note (Signed)
OPERATIVE REPORT - PARATHYROIDECTOMY  Preoperative diagnosis: Primary hyperparathyroidism  Postop diagnosis: Same  Procedure: Right superior minimally invasive parathyroidectomy  Surgeon:  Earnstine Regal, MD, FACS  Anesthesia: Gen. endotracheal  Estimated blood loss: Minimal  Preparation: ChloraPrep  Indications: Patient is referred by Dr. Delrae Rend for evaluation of newly diagnosed primary hyperparathyroidism. Patient presented with approximately one-year history of fatigue. She has a history of nephrolithiasis 4 years ago. She suffered an ankle fracture earlier this year. Bone density scan was obtained and showed osteopenia. Patient did have low vitamin D levels but is not currently under treatment. Laboratory studies show elevated serum calcium level of 11.7 with an intact PTH level of 65. 24-hour urine calcium collection was normal at 140. Patient underwent nuclear medicine parathyroid scan on February 02, 2015. This localized an adenoma to the right superior position.   Procedure: Patient was prepared in the holding area. He was brought to operating room and placed in a supine position on the operating room table. Following administration of general anesthesia, the patient was positioned and then prepped and draped in the usual strict aseptic fashion. After ascertaining that an adequate level of anesthesia been achieved, a neck incision was made with a #15 blade. Dissection was carried through subcutaneous tissues and platysma. Hemostasis was obtained with the electrocautery. Skin flaps were developed circumferentially and a Weitlander retractor was placed for exposure.  Strap muscles were incised in the midline. Strap muscles were reflected exposing the thyroid lobe. With gentle blunt dissection the thyroid lobe was mobilized.  Dissection was carried through adipose tissue and an enlarged parathyroid gland was identified. It was gently mobilized. Vascular structures were divided  between small and medium ligaclips. Care was taken to avoid the recurrent laryngeal nerve and the esophagus. The parathyroid gland was completely excised. It was submitted to pathology where frozen section confirmed parathyroid tissue consistent with adenoma.  Neck was irrigated with warm saline and good hemostasis was noted. Fibrillar was placed in the operative field. Strap muscles were reapproximated in the midline with interrupted 3-0 Vicryl sutures. Platysma was closed with interrupted 3-0 Vicryl sutures. Skin was closed with a running 4-0 Monocryl subcuticular suture. Marcaine was infiltrated circumferentially. Wound was washed and dried and benzoin and Steri-Strips were applied. Sterile gauze dressings were applied. Patient was awakened from anesthesia and brought to the recovery room. The patient tolerated the procedure well.   Earnstine Regal, MD, [facility]

## 2015-04-06 NOTE — Progress Notes (Signed)
Pt complains of back of her neck hurting, feels better when sitting up and with heat applied.  Pt encouraged to let her surgeon aware if pain continues and not improving.  Pt and her husband verbalized understanding.

## 2015-09-29 ENCOUNTER — Ambulatory Visit
Admission: RE | Admit: 2015-09-29 | Discharge: 2015-09-29 | Disposition: A | Discharge status: Home / Self Care | Payer: BLUE CROSS/BLUE SHIELD | Source: Ambulatory Visit | Attending: Internal Medicine | Admitting: Internal Medicine

## 2015-09-29 ENCOUNTER — Other Ambulatory Visit: Payer: Self-pay | Admitting: Internal Medicine

## 2015-09-29 DIAGNOSIS — M1990 Unspecified osteoarthritis, unspecified site: Secondary | ICD-10-CM

## 2016-09-11 ENCOUNTER — Other Ambulatory Visit: Payer: Self-pay | Admitting: Family Medicine

## 2016-09-11 DIAGNOSIS — R1030 Lower abdominal pain, unspecified: Secondary | ICD-10-CM

## 2016-09-18 ENCOUNTER — Ambulatory Visit
Admission: RE | Admit: 2016-09-18 | Discharge: 2016-09-18 | Disposition: A | Discharge status: Home / Self Care | Payer: BC Managed Care – PPO | Source: Ambulatory Visit | Attending: Family Medicine | Admitting: Family Medicine

## 2016-09-18 DIAGNOSIS — R1030 Lower abdominal pain, unspecified: Secondary | ICD-10-CM

## 2016-09-18 MED ORDER — IOPAMIDOL (ISOVUE-300) INJECTION 61%
100.0000 mL | Freq: Once | INTRAVENOUS | Status: AC | PRN
  Administered 2016-09-18: 100 mL via INTRAVENOUS

## 2016-10-17 ENCOUNTER — Ambulatory Visit (INDEPENDENT_AMBULATORY_CARE_PROVIDER_SITE_OTHER): Payer: BC Managed Care – PPO

## 2016-10-17 ENCOUNTER — Ambulatory Visit: Payer: BC Managed Care – PPO

## 2016-10-17 ENCOUNTER — Ambulatory Visit (INDEPENDENT_AMBULATORY_CARE_PROVIDER_SITE_OTHER): Payer: BC Managed Care – PPO | Admitting: Podiatry

## 2016-10-17 ENCOUNTER — Encounter: Payer: Self-pay | Admitting: Podiatry

## 2016-10-17 VITALS — BP 124/79 | HR 76

## 2016-10-17 DIAGNOSIS — M7662 Achilles tendinitis, left leg: Secondary | ICD-10-CM | POA: Diagnosis not present

## 2016-10-17 DIAGNOSIS — D649 Anemia, unspecified: Secondary | ICD-10-CM | POA: Insufficient documentation

## 2016-10-17 MED ORDER — METHYLPREDNISOLONE 4 MG PO TBPK: ORAL_TABLET | ORAL | 0 refills | Status: DC

## 2016-10-17 MED ORDER — MELOXICAM 15 MG PO TABS: 15.0000 mg | ORAL_TABLET | Freq: Every day | ORAL | 3 refills | Status: AC

## 2016-10-17 NOTE — Progress Notes (Signed)
She presents today with a chief complaint of pain to the back of her left heel 1 year. She states that she has a large knot on the back and this area that has grown over time.  Objective: Vital signs are stable she's alert and times 3I have reviewed her past history medications allergy surgery social history and review of systems. Pulses are strongly palpable. She is in no apparent distress. Neurologic sensorium is intact. Deep tendon reflexes are intact and brisk bilaterally. Orthopedic evaluation demonstrates DESPITE multiple range of motion without crepitation. She has good full range of motion on dorsiflexion with her knee locked she does have severe pain on palpation of the posterior aspect of the left heel with a large nonpulsatile mass is apparently bone with the Achilles tendon insertion. Radiographs demonstrating today large retrocalcaneal heel spur with thickening of the Achilles tendon at its insertion site. No lesions or wounds are noted.  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  Plan: I injected subcutaneously the area today with dexamethasone and local anesthetic 2 mg was injected. Placed her in a [place] Walker and a night splint. This prescribed methylprednisolone and meloxicam will follow up with her in 1 month.

## 2016-11-21 ENCOUNTER — Telehealth: Payer: Self-pay | Admitting: *Deleted

## 2016-11-21 ENCOUNTER — Ambulatory Visit (INDEPENDENT_AMBULATORY_CARE_PROVIDER_SITE_OTHER): Payer: BC Managed Care – PPO | Admitting: Podiatry

## 2016-11-21 ENCOUNTER — Encounter: Payer: Self-pay | Admitting: Podiatry

## 2016-11-21 DIAGNOSIS — M7662 Achilles tendinitis, left leg: Secondary | ICD-10-CM | POA: Diagnosis not present

## 2016-11-21 NOTE — Telephone Encounter (Addendum)
-----   Message from [place] sent at 11/21/2016 11:16 AM EDT ----- Regarding: MRI Orders are NOT in!!   MRI achilles tear left. Orders to [facility] and faxed to [place].

## 2016-11-22 NOTE — Progress Notes (Signed)
She presents today for follow-up of her Achilles tendinitis left. His hurt a little higher up than it was before. She states that she is not sure whether it's the do what it is but she states that is really not getting any better at this point.  Objective: She still has moderate to severe pain on palpation of the left posterior heel and tendo Achilles which is painful just above the level of the calcaneal insertion was superior margin of the calcaneus. She has no pain on medial and lateral compression of the calcaneus.  Assessment: Probable split tear of the tendo Achilles or the very least a tendo Achilles tendinitis.  Plan: All conservative therapies including steroids injections nonsteroidals and bracing have failed at this point I feel is necessary because of its failure to improve at all and possible worsening to have an MRI performed to evaluate this tendon for surgical management. Follow-up with her once the MRI has been read.

## 2016-12-10 ENCOUNTER — Telehealth: Payer: Self-pay | Admitting: *Deleted

## 2016-12-10 NOTE — Telephone Encounter (Signed)
We have a patient scheduled for a MRI of her left ankle on tomorrow.  It needs authorization.  She has [place]."  I will work on it.  I got MRI authorized.  The authorization number is [id number].  It is valid 12/10/2016 to 01/08/2017.  I called and informed Anderson [place] about the authorization.

## 2016-12-11 ENCOUNTER — Ambulatory Visit
Admission: RE | Admit: 2016-12-11 | Discharge: 2016-12-11 | Disposition: A | Discharge status: Home / Self Care | Payer: BC Managed Care – PPO | Source: Ambulatory Visit | Attending: Podiatry | Admitting: Podiatry

## 2016-12-16 ENCOUNTER — Telehealth: Payer: Self-pay | Admitting: Podiatry

## 2016-12-16 NOTE — Telephone Encounter (Signed)
Left message informing pt Dr. Milinda Pointer had reviewed the MRI results and was sending the MRI disc to a specialist for review of the minute details of the MRI, there would be a 7-10 day delay, to continue the care prescribed by Dr. Milinda Pointer on last visit.

## 2016-12-16 NOTE — Telephone Encounter (Addendum)
-----   Message from Garrel Ridgel, [place] sent at 12/16/2016  6:56 AM EDT ----- Please send for over read and inform patient of the delay.Left message informing pt of delay.

## 2016-12-16 NOTE — Telephone Encounter (Signed)
I had an MRI done last Wednesday and I have not received a call letting me know what the results were. If someone could please give me a call and let me know, I would certainly appreciate it. My phone number is [phone]. Thank you.

## 2016-12-16 NOTE — Telephone Encounter (Signed)
-----   Message from Garrel Ridgel, [place] sent at 12/16/2016  6:56 AM EDT ----- Please send for over read and inform patient of the delay.

## 2016-12-24 ENCOUNTER — Telehealth: Payer: Self-pay | Admitting: Podiatry

## 2016-12-24 NOTE — Telephone Encounter (Signed)
I am calling about my MRI that I had done two weeks ago tomorrow. The nurse did call and leave a message on my home phone to give it a few extra days because the specialists was going to look at it. I would really like a call back and be given some results or some kind of resolution to find out what the issue is. Please call my cell which is  [phone].

## 2016-12-24 NOTE — Telephone Encounter (Addendum)
[place] states she does have a final report and will fax to [phone]. I informed pt the results were in and she should remain in the boot and get an appt to see Dr. Milinda Pointer. Transferred to schedulers.

## 2016-12-26 ENCOUNTER — Telehealth: Payer: Self-pay | Admitting: *Deleted

## 2016-12-26 ENCOUNTER — Encounter: Payer: Self-pay | Admitting: Podiatry

## 2016-12-26 ENCOUNTER — Ambulatory Visit (INDEPENDENT_AMBULATORY_CARE_PROVIDER_SITE_OTHER): Payer: BC Managed Care – PPO | Admitting: Podiatry

## 2016-12-26 DIAGNOSIS — M7662 Achilles tendinitis, left leg: Secondary | ICD-10-CM

## 2016-12-26 NOTE — Telephone Encounter (Addendum)
-----   Message from Rip Har[place], [facility] sent at 12/26/2016 12:11 PM EDT ----- Regarding: [facility] referral - given to pt to sched. Referral sent with pt and faxed demographics to [facility].

## 2016-12-26 NOTE — Progress Notes (Signed)
She presents today for follow-up of her MRI report. She states that her Achilles tendon is still painful and she is tired of wearing his boot. She states that she's had this problem for more than a year and would like to see this resolved. She states that she is going on a trip in the near future and would like to discuss anything with her husband that we talk about today.  Objective: Vital signs are stable she is alert and oriented 3. Pulses are palpable she still has severe pain on palpation of the Achilles. She has tight gastrocsoleus complex. She has tenderness on palpation of the fascia. 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  Achilles tendinosis with a distal interstitial tear.  Plan: I recommended that she start with physical therapy until which time she can have surgery performed. We will send her to physical therapy and she will discuss surgical intervention with her husband. We discussed surgical intervention today in great detail understanding that she will need at least a couple weeks out of work and she will be in a cast for at least 4 weeks in a nonweightbearing fashion. She understands this is amenable to it and will follow-up with me in a couple of months or discussion.

## 2017-01-14 ENCOUNTER — Ambulatory Visit (INDEPENDENT_AMBULATORY_CARE_PROVIDER_SITE_OTHER): Payer: BC Managed Care – PPO | Admitting: Podiatry

## 2017-01-14 ENCOUNTER — Encounter: Payer: Self-pay | Admitting: Podiatry

## 2017-01-14 DIAGNOSIS — M7662 Achilles tendinitis, left leg: Secondary | ICD-10-CM

## 2017-01-14 NOTE — Patient Instructions (Signed)
Pre-Operative Instructions  Congratulations, you have decided to take an important step towards improving your quality of life.  You can be assured that the doctors and staff at [facility] will be with you every step of the way.  Here are some important things you should know:  1. Plan to be at the surgery center/hospital at least 1 (one) hour prior to your scheduled time, unless otherwise directed by the surgical center/hospital staff.  You must have a responsible adult accompany you, remain during the surgery and drive you home.  Make sure you have directions to the surgical center/hospital to ensure you arrive on time. 2. If you are having surgery at [facility] or [facility], you will need a copy of your medical history and physical form from your family physician within one month prior to the date of surgery. We will give you a form for your primary physician to complete.  3. We make every effort to accommodate the date you request for surgery.  However, there are times where surgery dates or times have to be moved.  We will contact you as soon as possible if a change in schedule is required.   4. No aspirin/ibuprofen for one week before surgery.  If you are on aspirin, any non-steroidal anti-inflammatory medications (Mobic, Aleve, Ibuprofen) should not be taken seven (7) days prior to your surgery.  You make take Tylenol for pain prior to surgery.  5. Medications - If you are taking daily heart and blood pressure medications, seizure, reflux, allergy, asthma, anxiety, pain or diabetes medications, make sure you notify the surgery center/hospital before the day of surgery so they can tell you which medications you should take or avoid the day of surgery. 6. No food or drink after midnight the night before surgery unless directed otherwise by surgical center/hospital staff. 7. No alcoholic beverages 24-hours prior to surgery.  No smoking 24-hours prior or 24-hours after  surgery. 8. Wear loose pants or shorts. They should be loose enough to fit over bandages, boots, and casts. 9. Don't wear slip-on shoes. Sneakers are preferred. 10. Bring your boot with you to the surgery center/hospital.  Also bring crutches or a walker if your physician has prescribed it for you.  If you do not have this equipment, it will be provided for you after surgery. 11. If you have not been contacted by the surgery center/hospital by the day before your surgery, call to confirm the date and time of your surgery. 12. Leave-time from work may vary depending on the type of surgery you have.  Appropriate arrangements should be made prior to surgery with your employer. 13. Prescriptions will be provided immediately following surgery by your doctor.  Fill these as soon as possible after surgery and take the medication as directed. Pain medications will not be refilled on weekends and must be approved by the doctor. 14. Remove nail polish on the operative foot and avoid getting pedicures prior to surgery. 15. Wash the night before surgery.  The night before surgery wash the foot and leg well with water and the antibacterial soap provided. Be sure to pay special attention to beneath the toenails and in between the toes.  Wash for at least three (3) minutes. Rinse thoroughly with water and dry well with a towel.  Perform this wash unless told not to do so by your physician.  Enclosed: 1 Ice pack (please put in freezer the night before surgery)   1 Hibiclens skin cleaner     Pre-op instructions  If you have any questions regarding the instructions, please do not hesitate to call our office.  [place]: [address], [place], [place] [address] -- [phone]  [place]: [address] [place], [place] [address] -- [phone]  [place]: [address]  [place], [place] [address] -- [phone]  [place]: [address], [address], [place], [place] [address] -- [phone]  Website:  [email or web address] 

## 2017-01-14 NOTE — Progress Notes (Signed)
She presents today for surgical consult regarding her Achilles tendon on her left foot. She states that she's tried physical therapy and nothing seems to help nothing has decreased the symptoms that are inhibiting her from enjoying her daily activities.  Objective: Vital signs are stable alert and oriented 3 at have reviewed her past mental history medications allergies surgeries and surgical history found no contraindications to surgical intervention. Pulses remain palpable pain she still has gastroc equinus or the likely what has resulted in her Achilles tendinitis and retrocalcaneal heel spur. She also has a superficial tear of the Achilles tendon at its insertion site according to the MRI.  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  Plan: We discussed the etiology pathology conservative versus surgical therapies. At this point I highly recommended surgical intervention consisting of a gastrocnemius recession left calf as well as a retrocalcaneal heel spur resection with an Achilles tendon lysis. We also will cast the left foot. Discussed the need for crutches and/or knee scooter. We discussed possible postop complications which may include but are not limited to facet pain bleeding swelling infection recurrence and need for further surgery overcorrection under correction. I will follow up with her in the near future for surgical intervention she signed all 3 pages of the consent form today. We discussed the anesthesia as well as the surgery center.

## 2017-01-16 ENCOUNTER — Ambulatory Visit: Payer: BC Managed Care – PPO | Admitting: Podiatry

## 2017-02-05 ENCOUNTER — Other Ambulatory Visit: Payer: Self-pay | Admitting: Podiatry

## 2017-02-05 MED ORDER — CEPHALEXIN 500 MG PO CAPS: 500.0000 mg | ORAL_CAPSULE | Freq: Three times a day (TID) | ORAL | 0 refills | Status: DC

## 2017-02-05 MED ORDER — TRAMADOL HCL 50 MG PO TABS: 50.0000 mg | ORAL_TABLET | Freq: Four times a day (QID) | ORAL | 0 refills | Status: AC | PRN

## 2017-02-05 MED ORDER — PROMETHAZINE HCL 25 MG PO TABS: 25.0000 mg | ORAL_TABLET | Freq: Three times a day (TID) | ORAL | 0 refills | Status: DC | PRN

## 2017-02-07 ENCOUNTER — Encounter: Payer: Self-pay | Admitting: Podiatry

## 2017-02-07 ENCOUNTER — Telehealth: Payer: Self-pay | Admitting: *Deleted

## 2017-02-07 NOTE — Telephone Encounter (Signed)
I'm calling to see if you would like to reschedule your surgery to October 18?  "Yes, I checked with my husband and he said that date will be fine.  What time?"  I cannot give you an exact time.  It will be sometime that morning.  Someone from the surgical center will call you with the arrival time one or two days prior to surgery date.

## 2017-02-12 ENCOUNTER — Encounter: Payer: Self-pay | Admitting: Podiatry

## 2017-02-13 ENCOUNTER — Ambulatory Visit: Payer: BC Managed Care – PPO

## 2017-02-13 DIAGNOSIS — M7662 Achilles tendinitis, left leg: Secondary | ICD-10-CM | POA: Diagnosis not present

## 2017-02-13 DIAGNOSIS — M7732 Calcaneal spur, left foot: Secondary | ICD-10-CM | POA: Diagnosis not present

## 2017-02-13 DIAGNOSIS — M216X2 Other acquired deformities of left foot: Secondary | ICD-10-CM | POA: Diagnosis not present

## 2017-02-20 ENCOUNTER — Ambulatory Visit (INDEPENDENT_AMBULATORY_CARE_PROVIDER_SITE_OTHER): Payer: BC Managed Care – PPO

## 2017-02-20 ENCOUNTER — Ambulatory Visit (INDEPENDENT_AMBULATORY_CARE_PROVIDER_SITE_OTHER): Payer: BC Managed Care – PPO | Admitting: Podiatry

## 2017-02-20 VITALS — BP 122/92 | HR 77 | Temp 98.0°F

## 2017-02-20 DIAGNOSIS — M7662 Achilles tendinitis, left leg: Secondary | ICD-10-CM | POA: Diagnosis not present

## 2017-02-22 NOTE — Progress Notes (Signed)
She presents today in her cast for her first postop visit date of surgery 02/13/2017 Achilles tendon lysis gastric recession calcaneal osteotomy she states that she still having quite a bit of pain in the cast doesn't feel right to her distal to heavy: She denies Pain chest pain shortness of breath nausea vomiting.  Objective: Cast is intact dry and clean. She has good sensation in her toes the cast is not tight around the calf he can move her toes actively passively without pain. She has good sensation. No signs of skin breakdown from 1 week and see.  Assessment: Well healing surgical foot.  Plan: Follow-up with [place] in one week for cast removal and reapplication.

## 2017-02-26 NOTE — Progress Notes (Signed)
DOS 10.12.18 Heel spur resect Lt, achilles tenolysis Lt gastroc recess Lt, cast app

## 2017-02-27 ENCOUNTER — Ambulatory Visit (INDEPENDENT_AMBULATORY_CARE_PROVIDER_SITE_OTHER): Payer: BC Managed Care – PPO | Admitting: Podiatry

## 2017-02-27 ENCOUNTER — Encounter: Payer: Self-pay | Admitting: Podiatry

## 2017-02-27 VITALS — BP 109/73 | HR 78 | Temp 98.2°F

## 2017-02-27 DIAGNOSIS — M7662 Achilles tendinitis, left leg: Secondary | ICD-10-CM

## 2017-03-01 NOTE — Progress Notes (Signed)
She presents today for her second postop visit status post retrocalcaneal heel spur resection and Achilles tendon lysis. She states that she is doing well with minimal pain.  Objective: Vital signs are stable she is alert and oriented 3. Pulses are palpable. Cast was intact was removed demonstrates no erythema cellulitis drainage or odor. Staples are intact. I see no signs of infection. Distal aspect of the incision appears to be somewhat of a eschar where there appeared to be bleeding beneath the skin. This is minimal.  Assessment: Well-healing surgical foot.  Plan redress today drastic compressive dressing placed her back in another cast will follow up with her in 2 weeks.

## 2017-03-13 ENCOUNTER — Encounter: Payer: Self-pay | Admitting: Podiatry

## 2017-03-13 ENCOUNTER — Ambulatory Visit: Payer: BC Managed Care – PPO | Admitting: Podiatry

## 2017-03-13 VITALS — BP 118/74 | Temp 97.8°F

## 2017-03-13 DIAGNOSIS — M7662 Achilles tendinitis, left leg: Secondary | ICD-10-CM

## 2017-03-13 NOTE — Progress Notes (Signed)
She presents today one month status post gastroc recession EPF and retrocalcaneal heel spur resection. She states that she is doing very well.  Objective: Vital signs are stable she is alert and oriented 3. Pulses are palpable area cast was removed does demonstrate mild edema no erythema cellulitis drainage or odor tickles are intact was removed margins remain well coapted. No pain on palpation at surgical sites.  Assessment: Healing surgical foot.  Plan: Put her in a compression anklet today and her Cam Walker she will remove the compression anklet at nighttime encouraged Cam Walker during the day and night splint at bedtime follow up with me in 1-2 weeks.

## 2017-03-24 ENCOUNTER — Telehealth: Payer: Self-pay | Admitting: Podiatry

## 2017-03-24 NOTE — Telephone Encounter (Signed)
I informed pt of Dr. Milinda Pointer orders to get bacroban ointment OTC and to cover with dry gauze, and to keep 03/27/2017 appt.

## 2017-03-24 NOTE — Telephone Encounter (Signed)
bactroban ointment.

## 2017-03-24 NOTE — Telephone Encounter (Signed)
Where staples were removed, pt is noticing some puffiness/redness. Just wanted to speak with the Nurse concerning her symptoms.

## 2017-03-27 ENCOUNTER — Ambulatory Visit (INDEPENDENT_AMBULATORY_CARE_PROVIDER_SITE_OTHER): Payer: BC Managed Care – PPO | Admitting: Podiatry

## 2017-03-27 ENCOUNTER — Encounter: Payer: Self-pay | Admitting: Podiatry

## 2017-03-27 VITALS — Temp 97.3°F

## 2017-03-27 DIAGNOSIS — M7662 Achilles tendinitis, left leg: Secondary | ICD-10-CM

## 2017-03-27 MED ORDER — DOXYCYCLINE HYCLATE 100 MG PO TABS: 100.0000 mg | ORAL_TABLET | Freq: Two times a day (BID) | ORAL | 0 refills | Status: DC

## 2017-03-29 NOTE — Progress Notes (Signed)
She presents today states that her posterior heel incision site has opened up distally and appears to be red and draining. She denies fever chills nausea vomiting muscle aches and pains and states that she's been washing it and keeping it clean. She states that she's been applying Neosporin or Bactroban ointment to it.  Objective: Vital signs are stable she is alert and oriented 3 she has no Pain and there is no swelling about the site. The distal most aspect of the incision is slightly dehisced only to the level of dermis. There is some fibrotic tissue that is present which was debrided today to bleeding. This was mildly tender for her. There is no purulence no drainage to culture.  Assessment: Wound dehiscence posterior inferior aspect of the surgical site left.  Plan: Discussed etiology pathology conservative versus surgical therapies. I'll place her on doxycycline for the next 10 days and encourage a dry bandage with tape to the site daily so that this will be debrided as the gauze is pulled away. I expressed to her that this would be tender she understands this and is amenable to it. She will continue the use of her tennis shoes making sure this does not rule out and I will follow-up with her in the next few days.

## 2017-04-03 ENCOUNTER — Encounter: Payer: Self-pay | Admitting: Podiatry

## 2017-04-03 ENCOUNTER — Ambulatory Visit (INDEPENDENT_AMBULATORY_CARE_PROVIDER_SITE_OTHER): Payer: BC Managed Care – PPO | Admitting: Podiatry

## 2017-04-03 DIAGNOSIS — M7662 Achilles tendinitis, left leg: Secondary | ICD-10-CM

## 2017-04-03 NOTE — Progress Notes (Signed)
She presents today for follow-up of the dehiscence of the posterior aspect of her left heel where her calcaneal exostectomy was performed with her Achilles tendon lysis.  The dehiscence has gone on to heal uneventfully with exception of a very small area of the distal most aspect of the incision site.  This measures less than 2 mm in total length.  I debrided all necrotic tissue for her today.  It appears to be healing nicely there is no signs of infection.  Assessment: Well-healing dehiscence retrocalcaneal spur resection gastroc equinus Achilles tendon lysis and EPF.  Plan: Continue current dressings keeping the foot dry and clean.  I did recommend that we follow-up with her next week just to make sure this is gone on to heal uneventfully she will continue the use of the Cam walker and hopefully within 2 weeks she will be ready to go to [employer] [facility].  I also recommended that she start taking her meloxicam to help decrease any of the inflammation that she will definitely encounter as she begins walking on this more.  Follow-up with her in 1 week sign visit.

## 2017-04-04 ENCOUNTER — Encounter: Payer: Self-pay | Admitting: Podiatry

## 2017-04-10 ENCOUNTER — Ambulatory Visit (INDEPENDENT_AMBULATORY_CARE_PROVIDER_SITE_OTHER): Payer: BC Managed Care – PPO | Admitting: Podiatry

## 2017-04-10 DIAGNOSIS — M7662 Achilles tendinitis, left leg: Secondary | ICD-10-CM

## 2017-04-10 MED ORDER — MUPIROCIN 2 % EX OINT: TOPICAL_OINTMENT | CUTANEOUS | 2 refills | Status: AC

## 2017-04-10 MED ORDER — DOXYCYCLINE HYCLATE 100 MG PO TABS: 100.0000 mg | ORAL_TABLET | Freq: Two times a day (BID) | ORAL | 0 refills | Status: DC

## 2017-04-10 NOTE — Progress Notes (Signed)
She presents today for follow-up of a nonhealing wound to the posterior aspect of her calcaneus at the inferior most incision site of her retrocalcaneal heel spur.  She states that is just not healed all the way but denies fever chills nausea vomiting muscle aches and pains.  States that she is ready to go to [employer] 1 week from today.  She states that she has been wearing a regular shoe and trying to place padding so that it would not rub.  Objective: Vital signs are stable she is alert and oriented x3.  Pulses are palpable.  Reactive hyperkeratosis surrounding the wound was sharply debrided today to bleeding.  This was mildly tender to her it does not probe deep.  It is very superficial wound.  At this point I feel that it should only heal within the next few weeks.  Assessment: Slowly healing wound posterior aspect of the left heel status post retrocalcaneal heel spur surgery.  Plan: Order doxycycline and Bactroban ointment to be applied once daily after cleansing with a scrub brush.  She will dressed with a compression dressing continue to use the boot or an open heeled shoe.  I will follow-up with her in 1 week 2 days prior to her leaving for [place].

## 2017-04-15 ENCOUNTER — Encounter: Payer: Self-pay | Admitting: Podiatry

## 2017-04-15 ENCOUNTER — Ambulatory Visit (INDEPENDENT_AMBULATORY_CARE_PROVIDER_SITE_OTHER): Payer: BC Managed Care – PPO | Admitting: Podiatry

## 2017-04-15 DIAGNOSIS — M7662 Achilles tendinitis, left leg: Secondary | ICD-10-CM

## 2017-04-15 NOTE — Progress Notes (Signed)
She presents today for follow-up of her slowly healing wound to the posterior aspect of her heel.  At this point is doing much better she says.  Objective: Vital signs are stable she is alert and oriented x3 superficial wound to the left heel at the distalmost aspect of a retrocalcaneal heel spur incision.  Appears to be healing granulation tissue epithelialization is occurring is no erythema cellulitis drainage or odor.  Assessment: Well-healing surgical foot.  Plan: Continue all conservative therapies at this point follow-up with me when she returns from [facility].

## 2017-05-01 ENCOUNTER — Encounter: Payer: Self-pay | Admitting: Podiatry

## 2017-05-01 ENCOUNTER — Ambulatory Visit (INDEPENDENT_AMBULATORY_CARE_PROVIDER_SITE_OTHER): Payer: BC Managed Care – PPO | Admitting: Podiatry

## 2017-05-01 DIAGNOSIS — M7662 Achilles tendinitis, left leg: Secondary | ICD-10-CM

## 2017-05-03 NOTE — Progress Notes (Signed)
She presents today for a slow healing wound posterior aspect of her left Achilles area.  She is status post Achilles tendon repair.  She just returned from [employer].  She states that she did a lot of walking there she did very well.  She states that the wound is seems to be healing.  Objective: At this point the wound is nearly healed 100% there is a very small area of dry flaky tissue overlying the wound there is no drainage on the bandage.  There is no erythema no signs of infection no purulence no malodor.  She has good range of motion of the Achilles tendon and the margins are easily palpable.  Assessment: Well-healing surgical foot well-healing delayed wound.  Plan: Encouraged her to continue doing the same therapies that she is doing until this is completely resolved.  I will follow-up with her in a couple of weeks to reevaluate.  She will notify me if there are any negative changes.

## 2017-05-15 ENCOUNTER — Encounter: Payer: BC Managed Care – PPO | Admitting: Podiatry

## 2017-05-27 ENCOUNTER — Ambulatory Visit (INDEPENDENT_AMBULATORY_CARE_PROVIDER_SITE_OTHER): Payer: BC Managed Care – PPO | Admitting: Podiatry

## 2017-05-27 DIAGNOSIS — M7662 Achilles tendinitis, left leg: Secondary | ICD-10-CM

## 2017-05-28 NOTE — Progress Notes (Signed)
She presents today states that it seems to be improving as she refers to her surgical foot left.  She is status post retrocalcaneal heel spur resection and Achilles tendon lysis which resulted in an open wound that was very slow to heal.  At this point today I see no open hole she states but every once in a while C a wet spot on my dressing.  Objective: Vital signs are stable she is alert and oriented x3 she denies fever chills nausea vomiting muscle aches and pains.  Foot appears to be healing very nicely she has absolutely no pain on range of motion or on palpation the area in the back appears to have healed nearly 100% there is just one area of what appears to be a dry flaking skin right over the incision which may be harboring a small wet area underneath but I do not see any signs of infection or foreign body.  Assessment: Slowly healing wound nearly back to 100% normal for the patient.  Plan: I placed her in a retro-calcaneal silicone dressing so that she can wear regular shoes and will follow up with her in a couple weeks to make sure she is doing well.  She does have any questions or concerns she will notify us immediately.

## 2017-06-24 ENCOUNTER — Encounter: Payer: Self-pay | Admitting: Podiatry

## 2017-06-24 ENCOUNTER — Ambulatory Visit (INDEPENDENT_AMBULATORY_CARE_PROVIDER_SITE_OTHER): Payer: BC Managed Care – PPO | Admitting: Podiatry

## 2017-06-24 DIAGNOSIS — M7662 Achilles tendinitis, left leg: Secondary | ICD-10-CM | POA: Diagnosis not present

## 2017-06-24 NOTE — Progress Notes (Signed)
She presents today for follow-up of her left Achilles and tendon Tina lysis gastroc recession retrocalcaneal spur resection endoscopic fasciotomy.  She states that it seems that the scar seems to be doing just fine is got a little bit of thickening on it but has had no drainage.  Date of surgery 02/13/2017.  Objective: Vital signs are stable she is alert and oriented x3 walks with a normal gait with a normal shoe.  Plantar flexion is +5/5 there is no open lesions or wounds at this point.  Assessment: Well-healing surgical foot.  Plan: Release

## 2017-06-24 NOTE — Patient Instructions (Signed)

## 2018-06-02 ENCOUNTER — Other Ambulatory Visit: Payer: Self-pay | Admitting: Podiatry

## 2018-06-02 DIAGNOSIS — M7662 Achilles tendinitis, left leg: Secondary | ICD-10-CM

## 2018-11-24 ENCOUNTER — Other Ambulatory Visit: Payer: Self-pay | Admitting: Internal Medicine

## 2018-11-24 ENCOUNTER — Ambulatory Visit
Admission: RE | Admit: 2018-11-24 | Discharge: 2018-11-24 | Disposition: A | Discharge status: Home / Self Care | Payer: BC Managed Care – PPO | Source: Ambulatory Visit | Attending: Internal Medicine | Admitting: Internal Medicine

## 2018-11-24 DIAGNOSIS — R609 Edema, unspecified: Secondary | ICD-10-CM

## 2018-11-24 IMAGING — DX LEFT KNEE - COMPLETE 4+ VIEW
4 series · 4 of 4 positions shown · non-contrast
Comparison: None.

CLINICAL DATA: Left knee joint swelling

EXAM:
LEFT KNEE - COMPLETE 4+ VIEW

[dg knee complete 4 views left (1 of 4)]
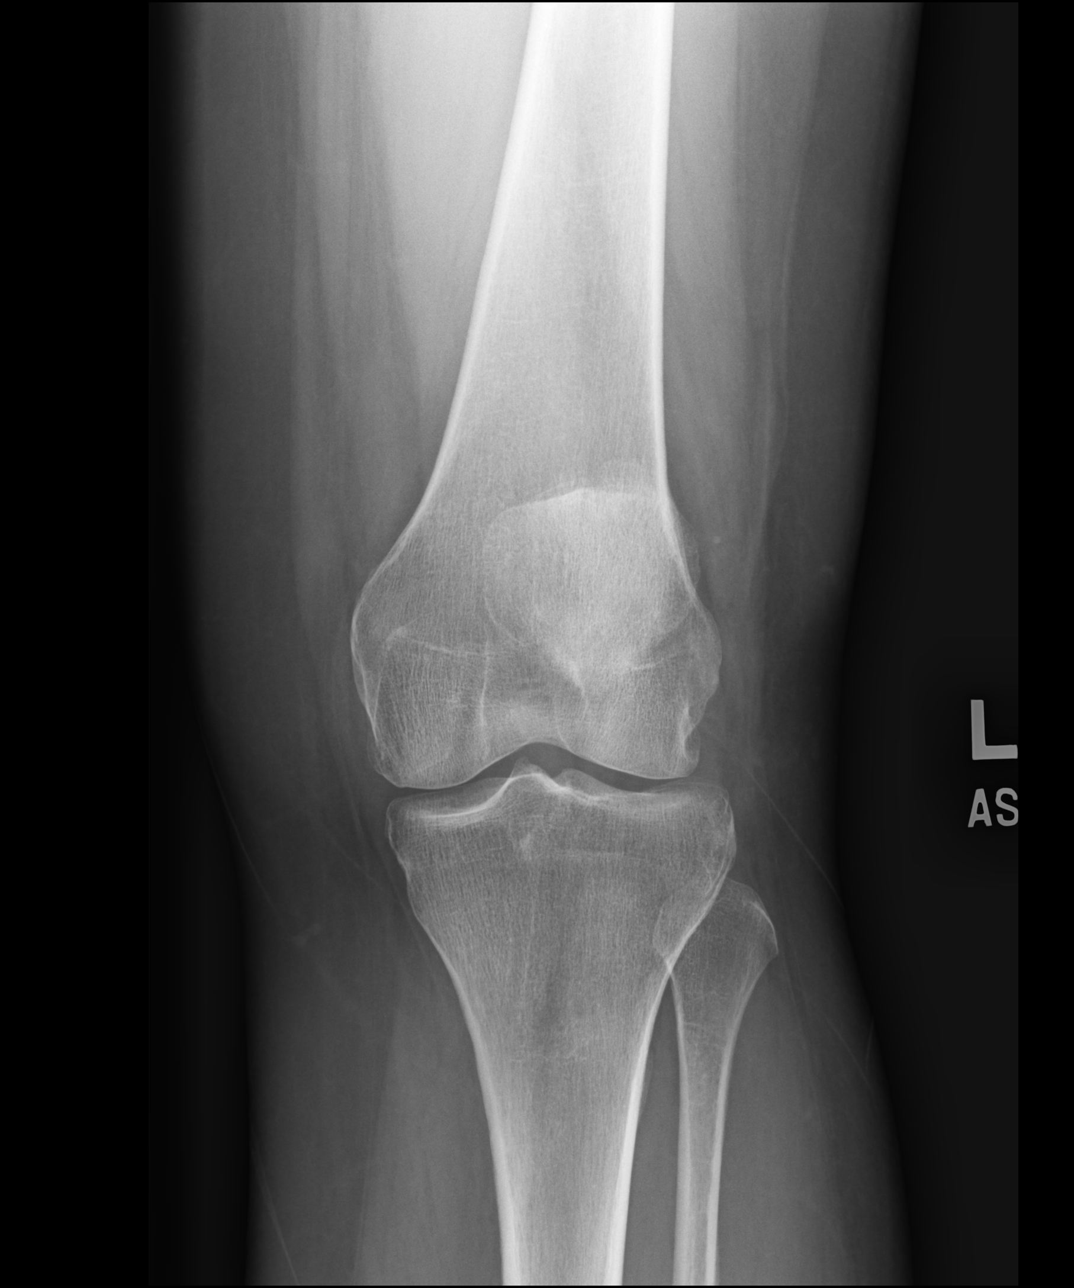

[dg knee complete 4 views left (2 of 4)]
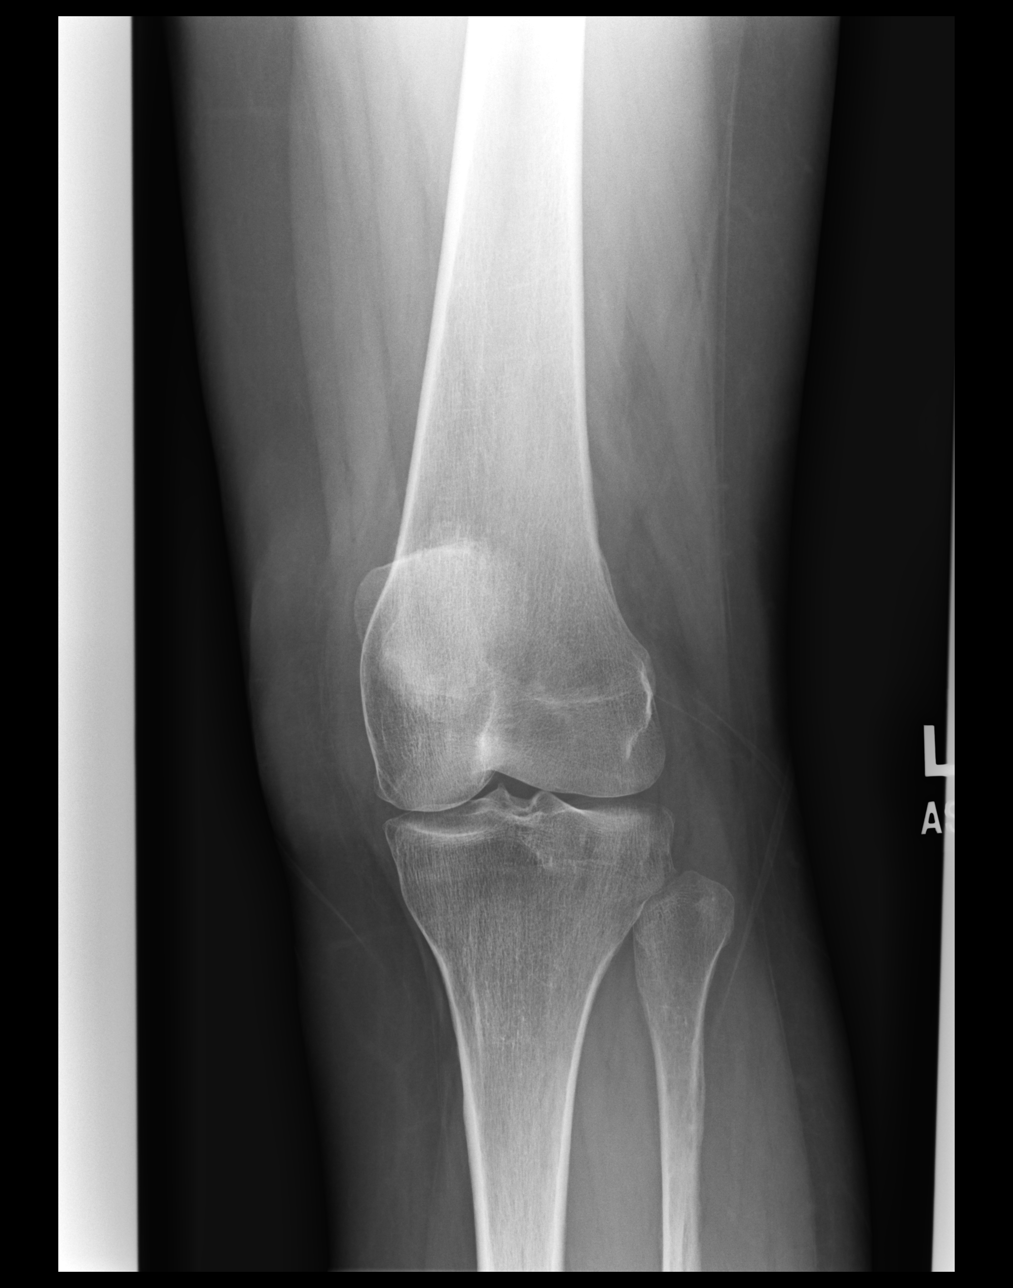

[dg knee complete 4 views left (3 of 4)]
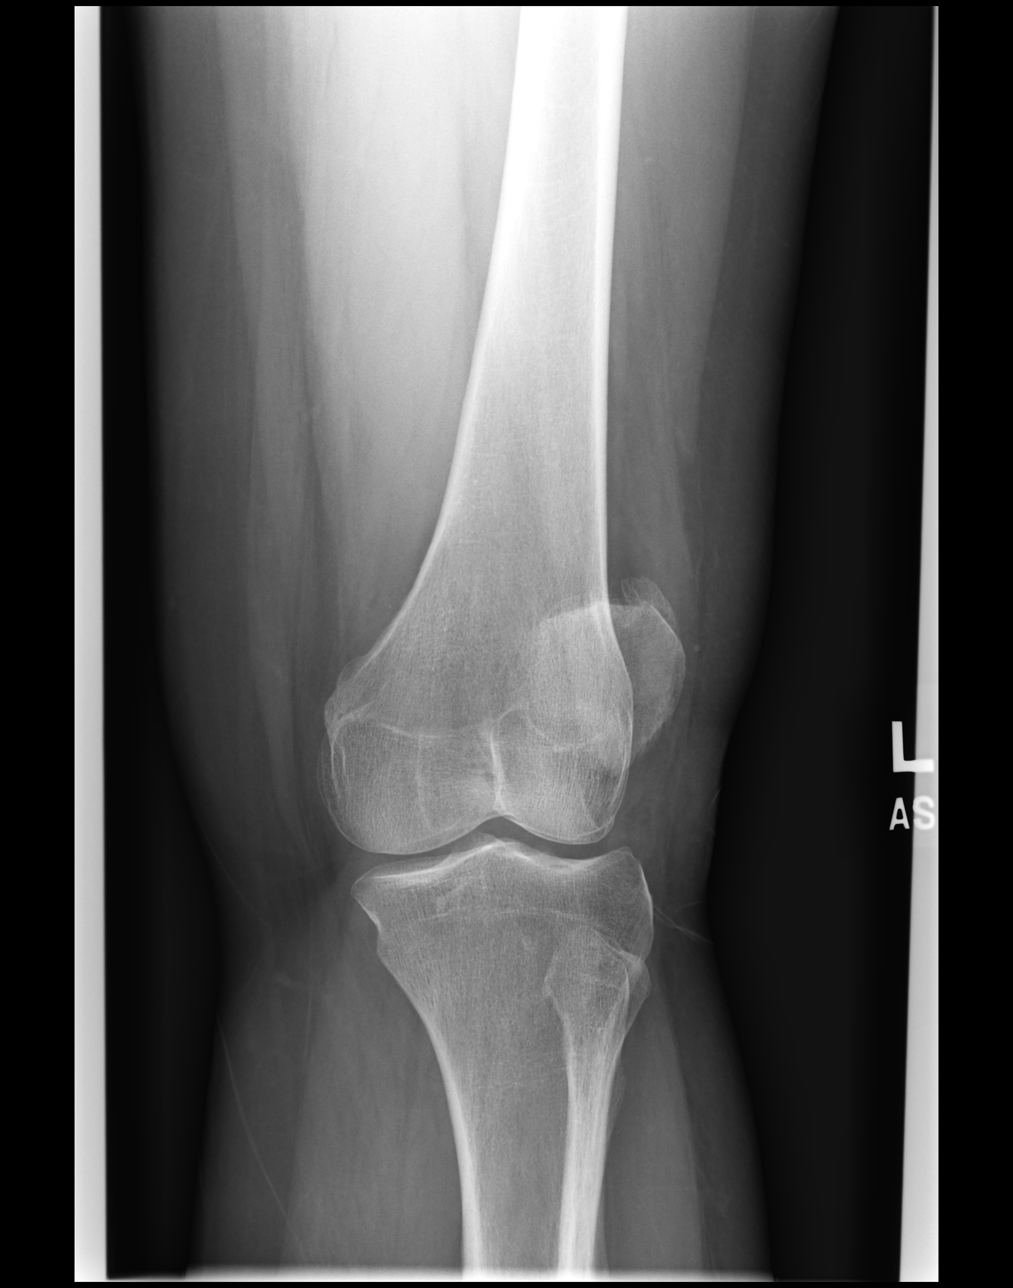

[dg knee complete 4 views left (4 of 4)]
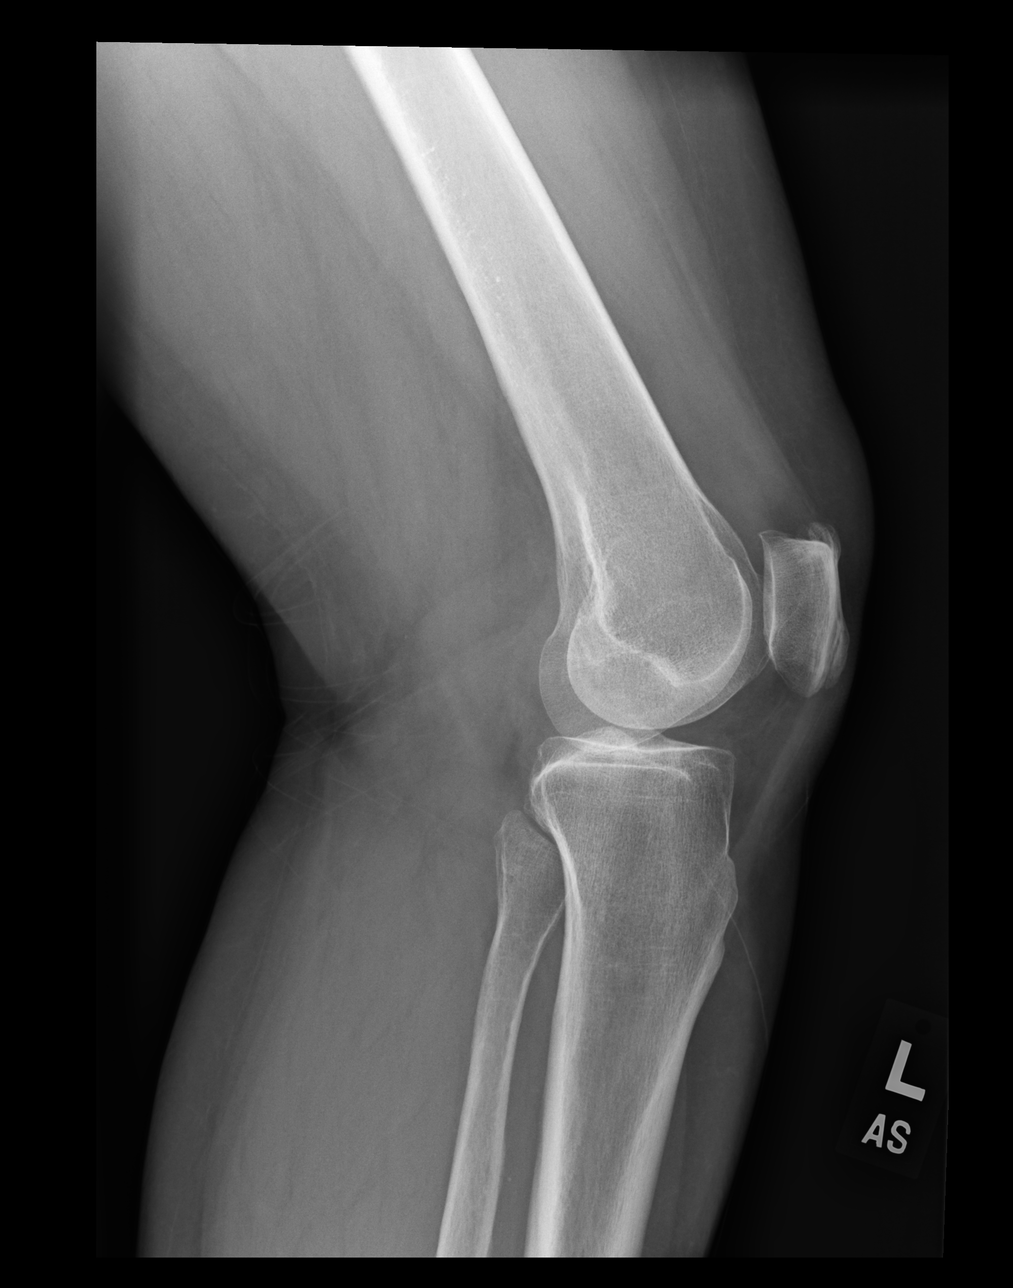

[4 of 4 positions shown; findings below may reference images not displayed]

FINDINGS: No fracture or dislocation of the left knee. There is mild
tricompartmental joint space narrowing with minimal osteophytosis.
Small, nonspecific knee joint effusion.
IMPRESSION: No fracture or dislocation of the left knee. There is mild
tricompartmental joint space narrowing with minimal osteophytosis.
Small, nonspecific knee joint effusion.

## 2019-01-13 NOTE — Progress Notes (Signed)
Subjective:   Patient ID: Danielle Mooney is a 58 y.o. female here for a Flu Vaccine visit.    Have you ever fainted, nearly fainted or been concerned about fainting after receiving an injection/vaccine?: No Are you sick today with a moderate to severe illness?: No Have you ever had a serious reaction to any vaccine in the past?: No   Has the VIS been reviewed?: Yes     Lifestyle: Danielle Mooney reports that she has never smoked. She does not have any smokeless tobacco history on file.   Objective:     Assessment/Plan:    Vaccine administered in accordance with [facility] guidelines.    Patient advised to contact [facility] if adverse event occurs.

## 2019-02-24 ENCOUNTER — Other Ambulatory Visit: Payer: Self-pay | Admitting: Internal Medicine

## 2019-02-24 DIAGNOSIS — M8580 Other specified disorders of bone density and structure, unspecified site: Secondary | ICD-10-CM

## 2019-03-17 ENCOUNTER — Other Ambulatory Visit: Payer: BC Managed Care – PPO

## 2019-05-09 ENCOUNTER — Ambulatory Visit: Payer: BC Managed Care – PPO | Attending: Internal Medicine

## 2019-05-09 ENCOUNTER — Other Ambulatory Visit: Payer: Self-pay

## 2019-05-09 DIAGNOSIS — Z23 Encounter for immunization: Secondary | ICD-10-CM | POA: Insufficient documentation

## 2019-05-09 NOTE — Progress Notes (Signed)
   Covid-19 Vaccination Clinic  Name:  Heather [name]    MRN: [id number] DOB: 05-15-1960  05/09/2019  Heather [name] was observed post Covid-19 immunization for 30 minutes based on pre-vaccination screening without incidence. She was provided with Vaccine Information Sheet and instruction to access the V-Safe system.   Heather [name] was instructed to call 911 with any severe reactions post vaccine: Marland Kitchen Difficulty breathing  . Swelling of your face and throat  . A fast heartbeat  . A bad rash all over your body  . Dizziness and weakness    Immunizations Administered    Name Date Dose VIS Date Route   Pfizer COVID-19 Vaccine 05/09/2019 11:07 AM 0.3 mL 04/09/2019 Intramuscular   Manufacturer: [employer], [employer]   Lot: [id number]   [place]: [id number]

## 2019-05-28 ENCOUNTER — Ambulatory Visit: Payer: BC Managed Care – PPO

## 2019-05-29 ENCOUNTER — Ambulatory Visit: Payer: BC Managed Care – PPO | Attending: Internal Medicine

## 2019-05-29 DIAGNOSIS — Z23 Encounter for immunization: Secondary | ICD-10-CM

## 2019-05-29 NOTE — Progress Notes (Signed)
   Covid-19 Vaccination Clinic  Name:  Heather [name]    MRN: [id number] DOB: 1961-03-18  05/29/2019  [name] was observed post Covid-19 immunization for 15 minutes without incidence. She was provided with Vaccine Information Sheet and instruction to access the V-Safe system.   [name] was instructed to call 911 with any severe reactions post vaccine: Marland Kitchen Difficulty breathing  . Swelling of your face and throat  . A fast heartbeat  . A bad rash all over your body  . Dizziness and weakness    Immunizations Administered    Name Date Dose VIS Date Route   Pfizer COVID-19 Vaccine 05/29/2019 12:21 PM 0.3 mL 04/09/2019 Intramuscular   Manufacturer: [place]   Lot: [id number]   [place]: [id number]

## 2019-06-08 ENCOUNTER — Ambulatory Visit
Admission: RE | Admit: 2019-06-08 | Discharge: 2019-06-08 | Disposition: A | Discharge status: Home / Self Care | Payer: BC Managed Care – PPO | Source: Ambulatory Visit | Attending: Internal Medicine | Admitting: Internal Medicine

## 2019-06-08 ENCOUNTER — Other Ambulatory Visit: Payer: Self-pay

## 2019-06-08 DIAGNOSIS — M8580 Other specified disorders of bone density and structure, unspecified site: Secondary | ICD-10-CM

## 2022-10-03 ENCOUNTER — Other Ambulatory Visit: Payer: Self-pay | Admitting: Nurse Practitioner

## 2022-11-26 ENCOUNTER — Ambulatory Visit (INDEPENDENT_AMBULATORY_CARE_PROVIDER_SITE_OTHER): Payer: BC Managed Care – PPO | Admitting: Cardiology

## 2022-11-26 ENCOUNTER — Encounter (INDEPENDENT_AMBULATORY_CARE_PROVIDER_SITE_OTHER): Payer: Self-pay | Admitting: Cardiology

## 2022-11-26 VITALS — BP 152/94 | HR 71 | Ht 65.0 in | Wt 174.0 lb

## 2022-11-26 DIAGNOSIS — E782 Mixed hyperlipidemia: Secondary | ICD-10-CM

## 2022-11-26 DIAGNOSIS — R030 Elevated blood-pressure reading, without diagnosis of hypertension: Secondary | ICD-10-CM

## 2022-11-26 DIAGNOSIS — R002 Palpitations: Secondary | ICD-10-CM

## 2022-11-26 LAB — ECG 12-LEAD
Atrial Rate: 73 {beats}/min
IHS MUSE NARRATIVE AND IMPRESSION: NORMAL
P Axis: 42 degrees
P-R Interval: 164 ms
Q-T Interval: 374 ms
QRS Duration: 72 ms
QTC Calculation (Bezet): 412 ms
R Axis: 25 degrees
T Axis: 40 degrees
Ventricular Rate: 73 {beats}/min

## 2022-11-26 NOTE — Progress Notes (Signed)
[facility] OFFICE PROGRESS NOTE    HRT [facility] OFFICE -CARDIOLOGY  [address]  [place] [address]  Dept: [phone]  Dept Fax: [phone]       Patient Name: [facility]  Date of Visit:  November 26, 2022  Date of Birth: 10/17/1960  AGE: 62 y.o.  Medical Record #: [id number]  Requesting Physician: Cleda Clarks, NP    CHIEF COMPLAINT: Establish Care    History of Present Illness  The patient is a pleasant 62 year old female who presents for evaluation and management of mildly elevated cholesterol.    She has been informed of a slight increase in her cholesterol levels, although she is uncertain about the specific figures. Her primary care physician suggested starting statin therapy, but she preferred to consult with a cardiologist first.  She recalls that her cholesterol was mildly elevated last year as well.  Recently, her husband developed gallbladder problem so they have been more careful about their diet.    She has no personal history of diabetes or hypertension and does not monitor her blood pressure at home. During her last visit, her blood pressure was recorded as 140 systolic, which did not raise concern with her doctor. However, she is slightly anxious as her blood pressure has always been on the lower side and she fears it may be increasing. Both her parents had hypertension from a young age, and one of her brothers also has high blood pressure. Her weight has remained stable.    She underwent a hysterectomy due to fibroids and adenomyosis, which caused severe bleeding. She believes she has experienced menopause, even though she has not had hot flashes. Her ovaries were not removed during the hysterectomy.    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 Her diet has improved recently, with increased fish consumption, due to her husband's gallbladder issues. She has never undergone heart-related tests such  as stress tests or ultrasounds, but had an EKG a few years ago. She occasionally experiences shortness of breath when climbing several flights of stairs or steep inclines, but attributes this to aging.    FAMILY HISTORY  She has a family history of heart disease. Her maternal grandfather died at 82 from a heart attack. She has a family history of diabetes on her mother's side.    PAST MEDICAL HISTORY: She has no past medical history on file. She has no past surgical history on file.    ALLERGIES: Allergies[1]    MEDICATIONS: No current outpatient medications on file.     FAMILY HISTORY: family history includes Abnormal EKG in her father; Aortic aneurysm in her maternal grandmother and mother; Atrial fibrillation in her mother; Hypertension in her mother; Implanted device in her father.    SOCIAL HISTORY: She reports that she has never smoked. She has never used smokeless tobacco. She reports current alcohol use. She reports that she does not use drugs.    PHYSICAL EXAMINATION  Visit Vitals  BP (!) 152/94 (BP Site: Right arm, Patient Position: Sitting, Cuff Size: Small)   Pulse 71   Ht 1.651 m (5\' 5" )   Wt 78.9 kg (174 lb)   BMI 28.96 kg/m     Constitutional: Cooperative, alert, no acute distress.  Neck: No carotid bruits, JVP normal.  Cardiac: Regular rate and rhythm, normal S1 and S2; no S3 or S4, no murmurs, no rubs, no gallops.  Pulmonary: Clear to auscultation bilaterally, no wheezing, no  rhonchi, no rales.  Extremities: no edema.  Vascular: +2 pulses in radial artery bilaterally, 2+ pedal pulses bilaterally.    ECG: Normal sinus rhythm, rate 73, delayed R wave transition    LABS:   Lipids 09/16/2022: TC 240, TG 280, HDL 38, LDL 146    IMPRESSION:   Ms. Swanigan is a 62 y.o. female with the following problems:    Dyslipidemia, with improved dietary habits recently  8.3% 10-year risk of CVD by prevent data (37.7%, 30-year risk)  Remote family history of heart attack in her maternal grandfather at age 77  Elevated  blood pressure without prior diagnosis of hypertension  Surgical menopause    RECOMMENDATIONS:    Discussed that Mediterranean diet (high in fruits, vegetables, whole grains, beans/nuts/seeds, olive oil, minimizing red meat in favor of fish and poultry) has been associated with reduced cardiovascular events.  Recommended regular physical activity (at least 150 minutes per week of moderate-intensity activity or 75 minutes per week of vigorous physical activity) to lower risk of ASCVD.  Coronary artery calcium score to detect subclinical atherosclerosis  Exercise treadmill test to establish baseline ischemic evaluation and look at blood pressure response  Repeat lipids in 6 months after diet and exercise intervention.  At her request, she would like [place] to relook at this so we will arrange a follow-up visit at that time.  Certainly if her tests return abnormal in the interim, we would follow-up earlier.                                                 Orders Placed This Encounter   Procedures    CT Cardiac Scoring    Cardiac Stress Test    Lipid Panel    ECG 12 lead (Normal)    Office Visit ([facility])       SIGNED:    Encarnacion Slates MD  [facility]    Verbal consent was obtained to record this visit.     This note was generated by the Dragon speech recognition and may contain errors or omissions not intended by the user. Grammatical errors, random word insertions, deletions, pronoun errors, and incomplete sentences are occasional consequences of this technology due to software limitations. Not all errors are caught or corrected. If there are questions or concerns about the content of this note or information contained within the body of this dictation, they should be addressed directly with the author for clarification.         [1]   Allergies  Allergen Reactions    Codeine Nausea And Vomiting

## 2022-11-27 ENCOUNTER — Encounter (INDEPENDENT_AMBULATORY_CARE_PROVIDER_SITE_OTHER): Payer: Self-pay

## 2022-12-05 ENCOUNTER — Encounter (INDEPENDENT_AMBULATORY_CARE_PROVIDER_SITE_OTHER): Payer: BC Managed Care – PPO

## 2023-01-03 ENCOUNTER — Ambulatory Visit (INDEPENDENT_AMBULATORY_CARE_PROVIDER_SITE_OTHER): Payer: BC Managed Care – PPO

## 2023-01-03 DIAGNOSIS — E782 Mixed hyperlipidemia: Secondary | ICD-10-CM

## 2023-01-03 DIAGNOSIS — R002 Palpitations: Secondary | ICD-10-CM

## 2023-01-05 LAB — CARDIAC STRESS TEST

## 2023-05-28 ENCOUNTER — Ambulatory Visit (INDEPENDENT_AMBULATORY_CARE_PROVIDER_SITE_OTHER): Payer: BC Managed Care – PPO | Admitting: Cardiology
